# Patient Record
Sex: Male | Born: 1945 | Race: Black or African American | Hispanic: No | Marital: Single
Health system: Southern US, Community
[De-identification: ages and names within clinical notes are randomized; demographics above are authoritative.]

## PROBLEM LIST (undated history)

## (undated) DIAGNOSIS — I609 Nontraumatic subarachnoid hemorrhage, unspecified: Secondary | ICD-10-CM

## (undated) DIAGNOSIS — J181 Lobar pneumonia, unspecified organism: Secondary | ICD-10-CM

## (undated) DIAGNOSIS — Z930 Tracheostomy status: Secondary | ICD-10-CM

## (undated) DIAGNOSIS — J9621 Acute and chronic respiratory failure with hypoxia: Secondary | ICD-10-CM

---

## 2018-10-07 ENCOUNTER — Inpatient Hospital Stay
Admission: RE | Admit: 2018-10-07 | Discharge: 2018-11-02 | Disposition: A | Discharge status: Hospice | Payer: Medicare Other | Source: Other Acute Inpatient Hospital | Attending: Internal Medicine | Admitting: Internal Medicine

## 2018-10-07 DIAGNOSIS — Z931 Gastrostomy status: Secondary | ICD-10-CM

## 2018-10-07 DIAGNOSIS — J9621 Acute and chronic respiratory failure with hypoxia: Secondary | ICD-10-CM | POA: Diagnosis present

## 2018-10-07 DIAGNOSIS — I609 Nontraumatic subarachnoid hemorrhage, unspecified: Secondary | ICD-10-CM

## 2018-10-07 DIAGNOSIS — J181 Lobar pneumonia, unspecified organism: Secondary | ICD-10-CM | POA: Diagnosis present

## 2018-10-07 DIAGNOSIS — Z452 Encounter for adjustment and management of vascular access device: Secondary | ICD-10-CM

## 2018-10-07 DIAGNOSIS — Z930 Tracheostomy status: Secondary | ICD-10-CM

## 2018-10-07 DIAGNOSIS — R058 Other specified cough: Secondary | ICD-10-CM

## 2018-10-07 DIAGNOSIS — R05 Cough: Secondary | ICD-10-CM

## 2018-10-07 HISTORY — DX: Nontraumatic subarachnoid hemorrhage, unspecified: I60.9

## 2018-10-07 HISTORY — DX: Acute and chronic respiratory failure with hypoxia: J96.21

## 2018-10-07 HISTORY — DX: Lobar pneumonia, unspecified organism: J18.1

## 2018-10-07 HISTORY — DX: Tracheostomy status: Z93.0

## 2018-10-08 ENCOUNTER — Other Ambulatory Visit (HOSPITAL_COMMUNITY): Payer: Self-pay

## 2018-10-08 MED ORDER — IOHEXOL 300 MG/ML  SOLN
50.0000 mL | Freq: Once | INTRAMUSCULAR | Status: AC | PRN
  Administered 2018-10-08: 01:00:00 50 mL

## 2018-10-10 ENCOUNTER — Other Ambulatory Visit (HOSPITAL_COMMUNITY): Payer: Self-pay

## 2018-10-10 LAB — CBC
HCT: 33.1 % — ABNORMAL LOW (ref 39.0–52.0)
Hemoglobin: 10.7 g/dL — ABNORMAL LOW (ref 13.0–17.0)
MCH: 28.6 pg (ref 26.0–34.0)
MCHC: 32.3 g/dL (ref 30.0–36.0)
MCV: 88.5 fL (ref 80.0–100.0)
Platelets: 193 10*3/uL (ref 150–400)
RBC: 3.74 MIL/uL — ABNORMAL LOW (ref 4.22–5.81)
RDW: 13.8 % (ref 11.5–15.5)
WBC: 8.9 10*3/uL (ref 4.0–10.5)
nRBC: 0 % (ref 0.0–0.2)

## 2018-10-10 LAB — COMPREHENSIVE METABOLIC PANEL
ALT: 82 U/L — ABNORMAL HIGH (ref 0–44)
AST: 42 U/L — ABNORMAL HIGH (ref 15–41)
Albumin: 2.5 g/dL — ABNORMAL LOW (ref 3.5–5.0)
Alkaline Phosphatase: 104 U/L (ref 38–126)
Anion gap: 11 (ref 5–15)
BUN: 15 mg/dL (ref 8–23)
CO2: 23 mmol/L (ref 22–32)
Calcium: 9.4 mg/dL (ref 8.9–10.3)
Chloride: 99 mmol/L (ref 98–111)
Creatinine, Ser: 0.6 mg/dL — ABNORMAL LOW (ref 0.61–1.24)
GFR calc Af Amer: >60 mL/min (ref >60)
GFR calc non Af Amer: >60 mL/min (ref >60)
Glucose, Bld: 141 mg/dL — ABNORMAL HIGH (ref 70–99)
Potassium: 4.3 mmol/L (ref 3.5–5.1)
Sodium: 133 mmol/L — ABNORMAL LOW (ref 135–145)
Total Bilirubin: 0.3 mg/dL (ref 0.3–1.2)
Total Protein: 6.8 g/dL (ref 6.5–8.1)

## 2018-10-10 LAB — EXPECTORATED SPUTUM ASSESSMENT W GRAM STAIN, RFLX TO RESP C

## 2018-10-11 DIAGNOSIS — Z930 Tracheostomy status: Secondary | ICD-10-CM

## 2018-10-11 DIAGNOSIS — Z931 Gastrostomy status: Secondary | ICD-10-CM | POA: Diagnosis not present

## 2018-10-11 DIAGNOSIS — J9621 Acute and chronic respiratory failure with hypoxia: Secondary | ICD-10-CM | POA: Diagnosis not present

## 2018-10-11 DIAGNOSIS — I609 Nontraumatic subarachnoid hemorrhage, unspecified: Secondary | ICD-10-CM | POA: Diagnosis not present

## 2018-10-11 DIAGNOSIS — J181 Lobar pneumonia, unspecified organism: Secondary | ICD-10-CM | POA: Diagnosis not present

## 2018-10-11 NOTE — Consult Note (Signed)
[facility]  Date of Service: 10/11/2018  PULMONARY CRITICAL CARE CONSULT   Carlos DeedsWade Aguilar  JXB:[id number]  DOB: 12/11/1945   DOA: 10/07/2018  Referring Physician: Carron CurieAli Hijazi, MD  HPI: Carlos Aguilar is a 73 y.o. male seen for follow up of Acute on Chronic Respiratory Failure.  Patient has multiple medical problems presents to the hospital with an intracranial hemorrhage.  Patient was initially evaluated found to have difficulty with ambulation.  Was evaluated with a CT scan of the head which showed a subarachnoid hemorrhage.  Patient did go to neurosurgery at that time and had an EVD placed.  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  The patient did have an embolization done however did not really have much in the way of improvement.  Subsequently patient ended up having to have a tracheostomy done because of failure to come off the ventilator.  Patient right now is on T collar and we are titrating the oxygen slowly.  Review of Systems:  ROS performed and is unremarkable other than noted above.  Past medical history: Subarachnoid hemorrhage Hypertension  Past surgical history: Tracheostomy PEG tube placement VP shunt Embolectomy  Family history: Noncontributory to the present illness  Social history: Unknown if ever smoking or drinking history. Medications: Reviewed on Rounds  Physical Exam:  Vitals: Temperature 98.8 pulse 83 respiratory rate 33 blood pressure 125/74 saturations 100%  Ventilator Settings off the ventilator right now on T collar currently on 28% FiO2  . General: Comfortable at this time . Eyes: Grossly normal lids, irises & conjunctiva . ENT: grossly tongue is normal . Neck: no obvious mass . Cardiovascular: S1-S2 normal no gallop or rub . Respiratory: No rhonchi no rales are noted . Abdomen: Soft and  nontender . Skin: no rash seen on limited exam . Musculoskeletal: not rigid . Psychiatric:unable to assess . Neurologic: no seizure no involuntary movements         Labs on Admission:  Basic Metabolic Panel: Recent Labs  Lab 10/10/18 0724  NA 133*  K 4.3  CL 99  CO2 23  GLUCOSE 141*  BUN 15  CREATININE 0.60*  CALCIUM 9.4    No results for input(s): PHART, PCO2ART, PO2ART, HCO3, O2SAT in the last 168 hours.  Liver Function Tests: Recent Labs  Lab 10/10/18 0724  AST 42*  ALT 82*  ALKPHOS 104  BILITOT 0.3  PROT 6.8  ALBUMIN 2.5*   No results for input(s): LIPASE, AMYLASE in the last 168 hours. No results for input(s): AMMONIA in the last 168 hours.  CBC: Recent Labs  Lab 10/10/18 0724  WBC 8.9  HGB 10.7*  HCT 33.1*  MCV 88.5  PLT 193    Cardiac Enzymes: No results for input(s): CKTOTAL, CKMB, CKMBINDEX, TROPONINI in the last 168 hours.  BNP (last 3 results) No results for input(s): BNP in the last 8760 hours.  ProBNP (last 3 results) No results for input(s): PROBNP in the last 8760 hours.   Radiological Exams on Admission: Dg Abdomen Peg Tube Location  Result Date: 10/08/2018 CLINICAL DATA:  Peg tube EXAM: ABDOMEN - 1 VIEW COMPARISON:  None. FINDINGS: Peg tube projects over the left upper abdomen. Contrast material is seen within the fundus of the stomach. No contrast extravasation. Nonobstructive bowel gas pattern. IMPRESSION: Peg tube within the stomach. Injected contrast is within the fundus of the stomach. No extravasation. Electronically  Signed   By: Rolm Baptise M.D.   On: 10/08/2018 01:39   Dg Chest Port 1 View  Result Date: 10/10/2018 CLINICAL DATA:  Chest pain and shortness of breath. EXAM: PORTABLE CHEST 1 VIEW COMPARISON:  10/08/2018 chest radiograph FINDINGS: This is a low volume film. A LEFT PICC line with tip overlying the SUPERIOR cavoatrial junction and a tracheostomy tube are again noted. Cardiomediastinal silhouette is unchanged.  Minimal bibasilar atelectasis noted. There is no evidence of focal airspace disease, pulmonary edema, suspicious pulmonary nodule/mass, pleural effusion, or pneumothorax. No acute bony abnormalities are identified. IMPRESSION: Low volume film with minimal bibasilar atelectasis. No other significant change. Electronically Signed   By: Margarette Canada M.D.   On: 10/10/2018 10:26   Dg Chest Port 1 View  Result Date: 10/08/2018 CLINICAL DATA:  PICC line EXAM: PORTABLE CHEST 1 VIEW COMPARISON:  None. FINDINGS: Left PICC line is in place with the tip in the upper right atrium. Tracheostomy projects over the mid trachea. Linear atelectasis at the left base. Right lung clear. Heart is normal size. No acute bony abnormality. IMPRESSION: Left PICC line tip in the upper right atrium. Left base atelectasis.  No active cardiopulmonary disease. Electronically Signed   By: Rolm Baptise M.D.   On: 10/08/2018 01:40    Assessment/Plan Active Problems:   Acute on chronic respiratory failure with hypoxia (HCC)   Subarachnoid hemorrhage, nontraumatic (HCC)   Lobar pneumonia, unspecified organism ([place])   Tracheostomy status ([place])   1. Acute on chronic respiratory failure with hypoxia currently patient is tolerating PT collar fairly well.  Patient now is on 28% FiO2 good saturations are noted.  Titrate oxygen continue aggressive pulmonary toilet. 2. Subarachnoid hemorrhage patient is status post embolization we will continue with supportive care and physical therapy as tolerated. 3. Lobar pneumonia was treated with antibiotics we will continue to monitor radiologically.  The latest x-ray showing low volume film with bibasilar infiltrates. 4. Tracheostomy status we are going to work towards eventual decannulation continue with supportive care  I have personally seen and evaluated the patient, evaluated laboratory and imaging results, formulated the assessment and plan and placed orders. The Patient requires high complexity  decision making for assessment and support.  Case was discussed on Rounds with the Respiratory Therapy Staff Time Spent 102minutes  Allyne Gee, MD [facility] Pulmonary Critical Care Medicine Sleep Medicine

## 2018-10-12 DIAGNOSIS — I609 Nontraumatic subarachnoid hemorrhage, unspecified: Secondary | ICD-10-CM | POA: Diagnosis not present

## 2018-10-12 DIAGNOSIS — Z930 Tracheostomy status: Secondary | ICD-10-CM | POA: Diagnosis not present

## 2018-10-12 DIAGNOSIS — J9621 Acute and chronic respiratory failure with hypoxia: Secondary | ICD-10-CM | POA: Diagnosis not present

## 2018-10-12 DIAGNOSIS — J181 Lobar pneumonia, unspecified organism: Secondary | ICD-10-CM | POA: Diagnosis not present

## 2018-10-12 NOTE — Progress Notes (Addendum)
Pulmonary Critical Care Medicine [place]   PULMONARY CRITICAL CARE SERVICE  PROGRESS NOTE  Date of Service: 10/12/2018  Carlos Aguilar  OZH:[id number]  DOB: [birth year]   DOA: 10/07/2018  Referring Physician: Merton Border, MD  HPI: Carlos Aguilar is a 73 y.o. male seen for follow up of Acute on Chronic Respiratory Failure.  Patient continues on aerosol trach 20% FiO2 using PMV with no difficulty.  Medications: Reviewed on Rounds  Physical Exam:  Vitals: Pulse 74 respirations 15 BP 138/81 O2 sat 1% temp 94.2  Ventilator Settings 21% ATC  . General: Comfortable at this time . Eyes: Grossly normal lids, irises & conjunctiva . ENT: grossly tongue is normal . Neck: no obvious mass . Cardiovascular: S1 S2 normal no gallop . Respiratory: No rales rhonchi noted . Abdomen: soft . Skin: no rash seen on limited exam . Musculoskeletal: not rigid . Psychiatric:unable to assess . Neurologic: no seizure no involuntary movements         Lab Data:   Basic Metabolic Panel: Recent Labs  Lab 10/10/18 0724  NA 133*  K 4.3  CL 99  CO2 23  GLUCOSE 141*  BUN 15  CREATININE 0.60*  CALCIUM 9.4    ABG: No results for input(s): PHART, PCO2ART, PO2ART, HCO3, O2SAT in the last 168 hours.  Liver Function Tests: Recent Labs  Lab 10/10/18 0724  AST 42*  ALT 82*  ALKPHOS 104  BILITOT 0.3  PROT 6.8  ALBUMIN 2.5*   No results for input(s): LIPASE, AMYLASE in the last 168 hours. No results for input(s): AMMONIA in the last 168 hours.  CBC: Recent Labs  Lab 10/10/18 0724  WBC 8.9  HGB 10.7*  HCT 33.1*  MCV 88.5  PLT 193    Cardiac Enzymes: No results for input(s): CKTOTAL, CKMB, CKMBINDEX, TROPONINI in the last 168 hours.  BNP (last 3 results) No results for input(s): BNP in the last 8760 hours.  ProBNP (last 3 results) No results for input(s): PROBNP in the last 8760 hours.  Radiological Exams: No results found.  Assessment/Plan Active Problems:    * No active hospital problems. *   1. Acute on chronic respiratory failure with hypoxia currently patient is tolerating PT collar fairly well.  Patient now is on 28% FiO2 good saturations are noted.  Titrate oxygen continue aggressive pulmonary toilet. 2. Subarachnoid hemorrhage patient is status post embolization we will continue with supportive care and physical therapy as tolerated. 3. Lobar pneumonia was treated with antibiotics we will continue to monitor radiologically.  The latest x-ray showing low volume film with bibasilar infiltrates. 4. Tracheostomy status we are going to work towards eventual decannulation continue with supportive care   I have personally seen and evaluated the patient, evaluated laboratory and imaging results, formulated the assessment and plan and placed orders. The Patient requires high complexity decision making for assessment and support.  Case was discussed on Rounds with the Respiratory Therapy Staff  Allyne Gee, MD [facility] Pulmonary Critical Care Medicine Sleep Medicine

## 2018-10-13 DIAGNOSIS — I609 Nontraumatic subarachnoid hemorrhage, unspecified: Secondary | ICD-10-CM | POA: Diagnosis not present

## 2018-10-13 DIAGNOSIS — J9621 Acute and chronic respiratory failure with hypoxia: Secondary | ICD-10-CM | POA: Diagnosis not present

## 2018-10-13 DIAGNOSIS — J181 Lobar pneumonia, unspecified organism: Secondary | ICD-10-CM | POA: Diagnosis not present

## 2018-10-13 DIAGNOSIS — Z930 Tracheostomy status: Secondary | ICD-10-CM | POA: Diagnosis not present

## 2018-10-13 LAB — CULTURE, RESPIRATORY W GRAM STAIN

## 2018-10-13 LAB — BASIC METABOLIC PANEL
Anion gap: 10 (ref 5–15)
BUN: 12 mg/dL (ref 8–23)
CO2: 26 mmol/L (ref 22–32)
Calcium: 8.9 mg/dL (ref 8.9–10.3)
Chloride: 97 mmol/L — ABNORMAL LOW (ref 98–111)
Creatinine, Ser: 0.44 mg/dL — ABNORMAL LOW (ref 0.61–1.24)
GFR calc Af Amer: >60 mL/min (ref >60)
GFR calc non Af Amer: >60 mL/min (ref >60)
Glucose, Bld: 141 mg/dL — ABNORMAL HIGH (ref 70–99)
Potassium: 4.1 mmol/L (ref 3.5–5.1)
Sodium: 133 mmol/L — ABNORMAL LOW (ref 135–145)

## 2018-10-13 LAB — C DIFFICILE QUICK SCREEN W PCR REFLEX
C Diff antigen: NEGATIVE
C Diff interpretation: NOT DETECTED
C Diff toxin: NEGATIVE

## 2018-10-13 NOTE — Progress Notes (Signed)
Pulmonary Critical Care Medicine [place]   PULMONARY CRITICAL CARE SERVICE  PROGRESS NOTE  Date of Service: 10/13/2018  Carlos Aguilar  GEX:[id number]  DOB: Oct 29, 1945   DOA: 10/07/2018  Referring Physician: Merton Border, MD  HPI: Carlos Aguilar is a 73 y.o. male seen for follow up of Acute on Chronic Respiratory Failure.  Patient at this time is on T collar we should be able to go ahead and change the trach out today right now is on 28% FiO2  Medications: Reviewed on Rounds  Physical Exam:  Vitals: Temperature 97.1 pulse 80 respiratory 26 blood pressure 119/69 saturations 96%  Ventilator Settings off the ventilator right now on T collar FiO2 28%  . General: Comfortable at this time . Eyes: Grossly normal lids, irises & conjunctiva . ENT: grossly tongue is normal . Neck: no obvious mass . Cardiovascular: S1 S2 normal no gallop . Respiratory: No rhonchi coarse breath sounds are noted . Abdomen: soft . Skin: no rash seen on limited exam . Musculoskeletal: not rigid . Psychiatric:unable to assess . Neurologic: no seizure no involuntary movements         Lab Data:   Basic Metabolic Panel: Recent Labs  Lab 10/10/18 0724 10/13/18 0706  NA 133* 133*  K 4.3 4.1  CL 99 97*  CO2 23 26  GLUCOSE 141* 141*  BUN 15 12  CREATININE 0.60* 0.44*  CALCIUM 9.4 8.9    ABG: No results for input(s): PHART, PCO2ART, PO2ART, HCO3, O2SAT in the last 168 hours.  Liver Function Tests: Recent Labs  Lab 10/10/18 0724  AST 42*  ALT 82*  ALKPHOS 104  BILITOT 0.3  PROT 6.8  ALBUMIN 2.5*   No results for input(s): LIPASE, AMYLASE in the last 168 hours. No results for input(s): AMMONIA in the last 168 hours.  CBC: Recent Labs  Lab 10/10/18 0724  WBC 8.9  HGB 10.7*  HCT 33.1*  MCV 88.5  PLT 193    Cardiac Enzymes: No results for input(s): CKTOTAL, CKMB, CKMBINDEX, TROPONINI in the last 168 hours.  BNP (last 3 results) No results for input(s): BNP in the  last 8760 hours.  ProBNP (last 3 results) No results for input(s): PROBNP in the last 8760 hours.  Radiological Exams: No results found.  Assessment/Plan Active Problems:   Acute on chronic respiratory failure with hypoxia (HCC)   Subarachnoid hemorrhage, nontraumatic (HCC)   Lobar pneumonia, unspecified organism ([place])   Tracheostomy status ([place])   1. Acute on chronic respiratory failure with hypoxia we will go ahead and change the trach out to a cuffless #6 trach today and continue to advance the weaning. 2. Subarachnoid hemorrhage grossly unchanged we will continue with supportive care 3. Lobar pneumonia treated follow-up radiologically 4. Tracheostomy status we will continue to wean   I have personally seen and evaluated the patient, evaluated laboratory and imaging results, formulated the assessment and plan and placed orders. The Patient requires high complexity decision making for assessment and support.  Case was discussed on Rounds with the Respiratory Therapy Staff  Allyne Gee, MD [facility] Pulmonary Critical Care Medicine Sleep Medicine

## 2018-10-14 DIAGNOSIS — J181 Lobar pneumonia, unspecified organism: Secondary | ICD-10-CM | POA: Diagnosis not present

## 2018-10-14 DIAGNOSIS — J9621 Acute and chronic respiratory failure with hypoxia: Secondary | ICD-10-CM | POA: Diagnosis not present

## 2018-10-14 DIAGNOSIS — I609 Nontraumatic subarachnoid hemorrhage, unspecified: Secondary | ICD-10-CM | POA: Diagnosis not present

## 2018-10-14 DIAGNOSIS — Z930 Tracheostomy status: Secondary | ICD-10-CM | POA: Diagnosis not present

## 2018-10-14 LAB — LEVETIRACETAM LEVEL: Levetiracetam Lvl: 31.7 ug/mL (ref 10.0–40.0)

## 2018-10-14 NOTE — Progress Notes (Addendum)
Pulmonary Critical Care Medicine [place]   PULMONARY CRITICAL CARE SERVICE  PROGRESS NOTE  Date of Service: 10/14/2018  Carlos Aguilar  AJO:[id number]  DOB: [birth year]   DOA: 10/07/2018  Referring Physician: Merton Border, MD  HPI: Carlos Aguilar is a 73 y.o. male seen for follow up of Acute on Chronic Respiratory Failure.  Patient mains on aerosol trach collar 28% FiO2 with moderate secretions.  Medications: Reviewed on Rounds  Physical Exam:  Vitals: Pulse 73 respirations 16 BP 110/63 O2 sat 100% temp 90.9  Ventilator Settings aerosol trach collar 28%  . General: Comfortable at this time . Eyes: Grossly normal lids, irises & conjunctiva . ENT: grossly tongue is normal . Neck: no obvious mass . Cardiovascular: S1 S2 normal no gallop . Respiratory: No rales or rhonchi noted . Abdomen: soft . Skin: no rash seen on limited exam . Musculoskeletal: not rigid . Psychiatric:unable to assess . Neurologic: no seizure no involuntary movements         Lab Data:   Basic Metabolic Panel: Recent Labs  Lab 10/10/18 0724 10/13/18 0706  NA 133* 133*  K 4.3 4.1  CL 99 97*  CO2 23 26  GLUCOSE 141* 141*  BUN 15 12  CREATININE 0.60* 0.44*  CALCIUM 9.4 8.9    ABG: No results for input(s): PHART, PCO2ART, PO2ART, HCO3, O2SAT in the last 168 hours.  Liver Function Tests: Recent Labs  Lab 10/10/18 0724  AST 42*  ALT 82*  ALKPHOS 104  BILITOT 0.3  PROT 6.8  ALBUMIN 2.5*   No results for input(s): LIPASE, AMYLASE in the last 168 hours. No results for input(s): AMMONIA in the last 168 hours.  CBC: Recent Labs  Lab 10/10/18 0724  WBC 8.9  HGB 10.7*  HCT 33.1*  MCV 88.5  PLT 193    Cardiac Enzymes: No results for input(s): CKTOTAL, CKMB, CKMBINDEX, TROPONINI in the last 168 hours.  BNP (last 3 results) No results for input(s): BNP in the last 8760 hours.  ProBNP (last 3 results) No results for input(s): PROBNP in the last 8760  hours.  Radiological Exams: No results found.  Assessment/Plan Active Problems:   Acute on chronic respiratory failure with hypoxia (HCC)   Subarachnoid hemorrhage, nontraumatic (HCC)   Lobar pneumonia, unspecified organism ([place])   Tracheostomy status ([place])   1. Acute on chronic respiratory failure with hypoxia currently patient is tolerating PT collar fairly well.  Patient now is on 28% FiO2 good saturations are noted.  Titrate oxygen continue aggressive pulmonary toilet. 2. Subarachnoid hemorrhage patient is status post embolization we will continue with supportive care and physical therapy as tolerated. 3. Lobar pneumonia was treated with antibiotics we will continue to monitor radiologically.  The latest x-ray showing low volume film with bibasilar infiltrates. 4. Tracheostomy status we are going to work towards eventual decannulation continue with supportive care   I have personally seen and evaluated the patient, evaluated laboratory and imaging results, formulated the assessment and plan and placed orders. The Patient requires high complexity decision making for assessment and support.  Case was discussed on Rounds with the Respiratory Therapy Staff  Allyne Gee, MD [facility] Pulmonary Critical Care Medicine Sleep Medicine

## 2018-10-15 ENCOUNTER — Encounter: Payer: Self-pay | Admitting: Internal Medicine

## 2018-10-15 DIAGNOSIS — I609 Nontraumatic subarachnoid hemorrhage, unspecified: Secondary | ICD-10-CM | POA: Diagnosis not present

## 2018-10-15 DIAGNOSIS — J181 Lobar pneumonia, unspecified organism: Secondary | ICD-10-CM | POA: Diagnosis present

## 2018-10-15 DIAGNOSIS — Z930 Tracheostomy status: Secondary | ICD-10-CM | POA: Diagnosis not present

## 2018-10-15 DIAGNOSIS — J9621 Acute and chronic respiratory failure with hypoxia: Secondary | ICD-10-CM | POA: Diagnosis not present

## 2018-10-15 LAB — BASIC METABOLIC PANEL
Anion gap: 10 (ref 5–15)
BUN: 7 mg/dL — ABNORMAL LOW (ref 8–23)
CO2: 29 mmol/L (ref 22–32)
Calcium: 9.1 mg/dL (ref 8.9–10.3)
Chloride: 96 mmol/L — ABNORMAL LOW (ref 98–111)
Creatinine, Ser: 0.51 mg/dL — ABNORMAL LOW (ref 0.61–1.24)
GFR calc Af Amer: >60 mL/min (ref >60)
GFR calc non Af Amer: >60 mL/min (ref >60)
Glucose, Bld: 127 mg/dL — ABNORMAL HIGH (ref 70–99)
Potassium: 4.1 mmol/L (ref 3.5–5.1)
Sodium: 135 mmol/L (ref 135–145)

## 2018-10-15 NOTE — Progress Notes (Addendum)
Pulmonary Critical Care Medicine [place]   PULMONARY CRITICAL CARE SERVICE  PROGRESS NOTE  Date of Service: 10/15/2018  Carlos Aguilar  GOT:[id number]  DOB: [birth year]   DOA: 10/07/2018  Referring Physician: Merton Border, MD  HPI: Carlos Aguilar is a 73 y.o. male seen for follow up of Acute on Chronic Respiratory Failure.  Patient is on aerosol trach collar 28% satting well with no distress.  Medications: Reviewed on Rounds  Physical Exam:  Vitals: Pulse 80 respirations 18 BP 122/71 O2 sat 100% temp 97.7  Ventilator Settings ATC 20%  . General: Comfortable at this time . Eyes: Grossly normal lids, irises & conjunctiva . ENT: grossly tongue is normal . Neck: no obvious mass . Cardiovascular: S1 S2 normal no gallop . Respiratory: No rales or rhonchi noted . Abdomen: soft . Skin: no rash seen on limited exam . Musculoskeletal: not rigid . Psychiatric:unable to assess . Neurologic: no seizure no involuntary movements         Lab Data:   Basic Metabolic Panel: Recent Labs  Lab 10/10/18 0724 10/13/18 0706 10/15/18 0714  NA 133* 133* 135  K 4.3 4.1 4.1  CL 99 97* 96*  CO2 23 26 29   GLUCOSE 141* 141* 127*  BUN 15 12 7*  CREATININE 0.60* 0.44* 0.51*  CALCIUM 9.4 8.9 9.1    ABG: No results for input(s): PHART, PCO2ART, PO2ART, HCO3, O2SAT in the last 168 hours.  Liver Function Tests: Recent Labs  Lab 10/10/18 0724  AST 42*  ALT 82*  ALKPHOS 104  BILITOT 0.3  PROT 6.8  ALBUMIN 2.5*   No results for input(s): LIPASE, AMYLASE in the last 168 hours. No results for input(s): AMMONIA in the last 168 hours.  CBC: Recent Labs  Lab 10/10/18 0724  WBC 8.9  HGB 10.7*  HCT 33.1*  MCV 88.5  PLT 193    Cardiac Enzymes: No results for input(s): CKTOTAL, CKMB, CKMBINDEX, TROPONINI in the last 168 hours.  BNP (last 3 results) No results for input(s): BNP in the last 8760 hours.  ProBNP (last 3 results) No results for input(s): PROBNP in the  last 8760 hours.  Radiological Exams: No results found.  Assessment/Plan Active Problems:   Acute on chronic respiratory failure with hypoxia (HCC)   Subarachnoid hemorrhage, nontraumatic (HCC)   Lobar pneumonia, unspecified organism ([place])   Tracheostomy status ([place])   1. Acute on chronic respiratory failure with hypoxia currently patient is tolerating PT collar fairly well.  Patient now is on 28% FiO2 good saturations are noted.  Titrate oxygen continue aggressive pulmonary toilet. 2. Subarachnoid hemorrhage patient is status post embolization continue supportive care 3. Lobar pneumonia was treated with antibiotics we will continue to monitor radiologically.  4. Tracheostomy status we are going to work towards eventual decannulation continue with supportive care   I have personally seen and evaluated the patient, evaluated laboratory and imaging results, formulated the assessment and plan and placed orders. The Patient requires high complexity decision making for assessment and support.  Case was discussed on Rounds with the Respiratory Therapy Staff  Allyne Gee, MD [facility] Pulmonary Critical Care Medicine Sleep Medicine

## 2018-10-17 DIAGNOSIS — Z930 Tracheostomy status: Secondary | ICD-10-CM | POA: Diagnosis not present

## 2018-10-17 DIAGNOSIS — J181 Lobar pneumonia, unspecified organism: Secondary | ICD-10-CM | POA: Diagnosis not present

## 2018-10-17 DIAGNOSIS — I609 Nontraumatic subarachnoid hemorrhage, unspecified: Secondary | ICD-10-CM | POA: Diagnosis not present

## 2018-10-17 DIAGNOSIS — J9621 Acute and chronic respiratory failure with hypoxia: Secondary | ICD-10-CM | POA: Diagnosis not present

## 2018-10-17 LAB — CBC
HCT: 31.2 % — ABNORMAL LOW (ref 39.0–52.0)
Hemoglobin: 9.7 g/dL — ABNORMAL LOW (ref 13.0–17.0)
MCH: 27.8 pg (ref 26.0–34.0)
MCHC: 31.1 g/dL (ref 30.0–36.0)
MCV: 89.4 fL (ref 80.0–100.0)
Platelets: 127 10*3/uL — ABNORMAL LOW (ref 150–400)
RBC: 3.49 MIL/uL — ABNORMAL LOW (ref 4.22–5.81)
RDW: 14.6 % (ref 11.5–15.5)
WBC: 7.1 10*3/uL (ref 4.0–10.5)
nRBC: 0 % (ref 0.0–0.2)

## 2018-10-17 LAB — BASIC METABOLIC PANEL
Anion gap: 10 (ref 5–15)
BUN: 8 mg/dL (ref 8–23)
CO2: 30 mmol/L (ref 22–32)
Calcium: 9.1 mg/dL (ref 8.9–10.3)
Chloride: 94 mmol/L — ABNORMAL LOW (ref 98–111)
Creatinine, Ser: 0.5 mg/dL — ABNORMAL LOW (ref 0.61–1.24)
GFR calc Af Amer: >60 mL/min (ref >60)
GFR calc non Af Amer: >60 mL/min (ref >60)
Glucose, Bld: 137 mg/dL — ABNORMAL HIGH (ref 70–99)
Potassium: 3.8 mmol/L (ref 3.5–5.1)
Sodium: 134 mmol/L — ABNORMAL LOW (ref 135–145)

## 2018-10-17 NOTE — Progress Notes (Signed)
Pulmonary Critical Care Medicine [place]   PULMONARY CRITICAL CARE SERVICE  PROGRESS NOTE  Date of Service: 10/17/2018  Carlos Aguilar  NAT:[id number]  DOB: 07-02-1945   DOA: 10/07/2018  Referring Physician: Merton Border, MD  HPI: Carlos Aguilar is a 72 y.o. male seen for follow up of Acute on Chronic Respiratory Failure.  Patient is comfortable right now without distress is on T collar has been on 28% FiO2  Medications: Reviewed on Rounds  Physical Exam:  Vitals: Temperature 95.9 pulse 100 respiratory rate 16 blood pressure 129/79 saturations 99%  Ventilator Settings patient currently is on T collar with a 28% FiO2  . General: Comfortable at this time . Eyes: Grossly normal lids, irises & conjunctiva . ENT: grossly tongue is normal . Neck: no obvious mass . Cardiovascular: S1 S2 normal no gallop . Respiratory: No rhonchi no rales are noted at this time . Abdomen: soft . Skin: no rash seen on limited exam . Musculoskeletal: not rigid . Psychiatric:unable to assess . Neurologic: no seizure no involuntary movements         Lab Data:   Basic Metabolic Panel: Recent Labs  Lab 10/13/18 0706 10/15/18 0714 10/17/18 0707  NA 133* 135 134*  K 4.1 4.1 3.8  CL 97* 96* 94*  CO2 26 29 30   GLUCOSE 141* 127* 137*  BUN 12 7* 8  CREATININE 0.44* 0.51* 0.50*  CALCIUM 8.9 9.1 9.1    ABG: No results for input(s): PHART, PCO2ART, PO2ART, HCO3, O2SAT in the last 168 hours.  Liver Function Tests: No results for input(s): AST, ALT, ALKPHOS, BILITOT, PROT, ALBUMIN in the last 168 hours. No results for input(s): LIPASE, AMYLASE in the last 168 hours. No results for input(s): AMMONIA in the last 168 hours.  CBC: Recent Labs  Lab 10/17/18 0707  WBC 7.1  HGB 9.7*  HCT 31.2*  MCV 89.4  PLT 127*    Cardiac Enzymes: No results for input(s): CKTOTAL, CKMB, CKMBINDEX, TROPONINI in the last 168 hours.  BNP (last 3 results) No results for input(s): BNP in the  last 8760 hours.  ProBNP (last 3 results) No results for input(s): PROBNP in the last 8760 hours.  Radiological Exams: No results found.  Assessment/Plan Active Problems:   Acute on chronic respiratory failure with hypoxia (HCC)   Subarachnoid hemorrhage, nontraumatic (HCC)   Lobar pneumonia, unspecified organism ([place])   Tracheostomy status ([place])   1. Acute on chronic respiratory failure with hypoxia we will continue T collar continue aggressive pulmonary toilet supportive care. 2. Subarachnoid hemorrhage patient is grossly unchanged we will continue to follow along. 3. Lobar pneumonia treated showing signs of improvement 4. Tracheostomy working towards capping and eventual decannulation.   I have personally seen and evaluated the patient, evaluated laboratory and imaging results, formulated the assessment and plan and placed orders. The Patient requires high complexity decision making for assessment and support.  Case was discussed on Rounds with the Respiratory Therapy Staff  Allyne Gee, MD [facility] Pulmonary Critical Care Medicine Sleep Medicine

## 2018-10-18 DIAGNOSIS — J181 Lobar pneumonia, unspecified organism: Secondary | ICD-10-CM | POA: Diagnosis not present

## 2018-10-18 DIAGNOSIS — I609 Nontraumatic subarachnoid hemorrhage, unspecified: Secondary | ICD-10-CM | POA: Diagnosis not present

## 2018-10-18 DIAGNOSIS — Z930 Tracheostomy status: Secondary | ICD-10-CM | POA: Diagnosis not present

## 2018-10-18 DIAGNOSIS — J9621 Acute and chronic respiratory failure with hypoxia: Secondary | ICD-10-CM | POA: Diagnosis not present

## 2018-10-18 NOTE — Progress Notes (Signed)
Pulmonary Critical Care Medicine [place]   PULMONARY CRITICAL CARE SERVICE  PROGRESS NOTE  Date of Service: 10/18/2018  Carlos Aguilar  [id number]  DOB: 1945-03-05   DOA: 10/07/2018  Referring Physician: Merton Border, MD  HPI: Carlos Aguilar is a 73 y.o. male seen for follow up of Acute on Chronic Respiratory Failure.  Patient is doing well right now is collar has been on 28% FiO2 reportedly has moderate secretions still  Medications: Reviewed on Rounds  Physical Exam:  Vitals: Temperature 96.4 pulse 98 respiratory rate 16 blood pressure 114/65 saturations 98%  Ventilator Settings off the ventilator right now on T collar with an FiO2 of 28%  . General: Comfortable at this time . Eyes: Grossly normal lids, irises & conjunctiva . ENT: grossly tongue is normal . Neck: no obvious mass . Cardiovascular: S1 S2 normal no gallop . Respiratory: No rhonchi coarse breath sounds are noted . Abdomen: soft . Skin: no rash seen on limited exam . Musculoskeletal: not rigid . Psychiatric:unable to assess . Neurologic: no seizure no involuntary movements         Lab Data:   Basic Metabolic Panel: Recent Labs  Lab 10/13/18 0706 10/15/18 0714 10/17/18 0707  NA 133* 135 134*  K 4.1 4.1 3.8  CL 97* 96* 94*  CO2 26 29 30   GLUCOSE 141* 127* 137*  BUN 12 7* 8  CREATININE 0.44* 0.51* 0.50*  CALCIUM 8.9 9.1 9.1    ABG: No results for input(s): PHART, PCO2ART, PO2ART, HCO3, O2SAT in the last 168 hours.  Liver Function Tests: No results for input(s): AST, ALT, ALKPHOS, BILITOT, PROT, ALBUMIN in the last 168 hours. No results for input(s): LIPASE, AMYLASE in the last 168 hours. No results for input(s): AMMONIA in the last 168 hours.  CBC: Recent Labs  Lab 10/17/18 0707  WBC 7.1  HGB 9.7*  HCT 31.2*  MCV 89.4  PLT 127*    Cardiac Enzymes: No results for input(s): CKTOTAL, CKMB, CKMBINDEX, TROPONINI in the last 168 hours.  BNP (last 3 results) No results  for input(s): BNP in the last 8760 hours.  ProBNP (last 3 results) No results for input(s): PROBNP in the last 8760 hours.  Radiological Exams: No results found.  Assessment/Plan Active Problems:   Acute on chronic respiratory failure with hypoxia (HCC)   Subarachnoid hemorrhage, nontraumatic (HCC)   Lobar pneumonia, unspecified organism ([place])   Tracheostomy status ([place])   1. Acute on chronic respiratory failure with hypoxia we will continue with T collar trials titrate oxygen continue pulmonary toilet. 2. Subarachnoid hemorrhage nontraumatic we will continue with supportive care 3. Lobar pneumonia unspecified continue with present management 4. Tracheostomy working towards self only capping trials   I have personally seen and evaluated the patient, evaluated laboratory and imaging results, formulated the assessment and plan and placed orders. The Patient requires high complexity decision making for assessment and support.  Case was discussed on Rounds with the Respiratory Therapy Staff  Allyne Gee, MD [facility] Pulmonary Critical Care Medicine Sleep Medicine

## 2018-10-19 DIAGNOSIS — I609 Nontraumatic subarachnoid hemorrhage, unspecified: Secondary | ICD-10-CM | POA: Diagnosis not present

## 2018-10-19 DIAGNOSIS — Z930 Tracheostomy status: Secondary | ICD-10-CM | POA: Diagnosis not present

## 2018-10-19 DIAGNOSIS — J181 Lobar pneumonia, unspecified organism: Secondary | ICD-10-CM | POA: Diagnosis not present

## 2018-10-19 DIAGNOSIS — J9621 Acute and chronic respiratory failure with hypoxia: Secondary | ICD-10-CM | POA: Diagnosis not present

## 2018-10-19 LAB — BASIC METABOLIC PANEL
Anion gap: 10 (ref 5–15)
BUN: 8 mg/dL (ref 8–23)
CO2: 28 mmol/L (ref 22–32)
Calcium: 9 mg/dL (ref 8.9–10.3)
Chloride: 93 mmol/L — ABNORMAL LOW (ref 98–111)
Creatinine, Ser: 0.46 mg/dL — ABNORMAL LOW (ref 0.61–1.24)
GFR calc Af Amer: >60 mL/min (ref >60)
GFR calc non Af Amer: >60 mL/min (ref >60)
Glucose, Bld: 114 mg/dL — ABNORMAL HIGH (ref 70–99)
Potassium: 3.8 mmol/L (ref 3.5–5.1)
Sodium: 131 mmol/L — ABNORMAL LOW (ref 135–145)

## 2018-10-19 NOTE — Progress Notes (Signed)
Pulmonary Critical Care Medicine [place]   PULMONARY CRITICAL CARE SERVICE  PROGRESS NOTE  Date of Service: 10/19/2018  Matyas Baisley  [id number]  DOB: [birth year]   DOA: 10/07/2018  Referring Physician: Merton Border, MD  HPI: Carlos Aguilar is a 73 y.o. male seen for follow up of Acute on Chronic Respiratory Failure.  Patient is currently on T collar has been on 28% FiO2 with good saturations.  Medications: Reviewed on Rounds  Physical Exam:  Vitals: Temperature 96.7 pulse 76 respiratory rate 17 blood pressure 137/73 saturations 95%  Ventilator Settings off the ventilator right now on T collar  . General: Comfortable at this time . Eyes: Grossly normal lids, irises & conjunctiva . ENT: grossly tongue is normal . Neck: no obvious mass . Cardiovascular: S1 S2 normal no gallop . Respiratory: No rhonchi coarse breath sounds are noted . Abdomen: soft . Skin: no rash seen on limited exam . Musculoskeletal: not rigid . Psychiatric:unable to assess . Neurologic: no seizure no involuntary movements         Lab Data:   Basic Metabolic Panel: Recent Labs  Lab 10/13/18 0706 10/15/18 0714 10/17/18 0707 10/19/18 0558  NA 133* 135 134* 131*  K 4.1 4.1 3.8 3.8  CL 97* 96* 94* 93*  CO2 26 29 30 28   GLUCOSE 141* 127* 137* 114*  BUN 12 7* 8 8  CREATININE 0.44* 0.51* 0.50* 0.46*  CALCIUM 8.9 9.1 9.1 9.0    ABG: No results for input(s): PHART, PCO2ART, PO2ART, HCO3, O2SAT in the last 168 hours.  Liver Function Tests: No results for input(s): AST, ALT, ALKPHOS, BILITOT, PROT, ALBUMIN in the last 168 hours. No results for input(s): LIPASE, AMYLASE in the last 168 hours. No results for input(s): AMMONIA in the last 168 hours.  CBC: Recent Labs  Lab 10/17/18 0707  WBC 7.1  HGB 9.7*  HCT 31.2*  MCV 89.4  PLT 127*    Cardiac Enzymes: No results for input(s): CKTOTAL, CKMB, CKMBINDEX, TROPONINI in the last 168 hours.  BNP (last 3 results) No  results for input(s): BNP in the last 8760 hours.  ProBNP (last 3 results) No results for input(s): PROBNP in the last 8760 hours.  Radiological Exams: No results found.  Assessment/Plan Active Problems:   Acute on chronic respiratory failure with hypoxia (HCC)   Subarachnoid hemorrhage, nontraumatic (HCC)   Lobar pneumonia, unspecified organism ([place])   Tracheostomy status ([place])   1. Acute on chronic respiratory failure with hypoxia we will continue with T collar trials titrate oxygen continue pulmonary toilet. 2. Subarachnoid hemorrhage unchanged continue with supportive care 3. Lobar pneumonia treated clinically improving white count is normal 4. Tracheostomy remains in place   I have personally seen and evaluated the patient, evaluated laboratory and imaging results, formulated the assessment and plan and placed orders. The Patient requires high complexity decision making for assessment and support.  Case was discussed on Rounds with the Respiratory Therapy Staff  Allyne Gee, MD [facility] Pulmonary Critical Care Medicine Sleep Medicine

## 2018-10-20 DIAGNOSIS — Z930 Tracheostomy status: Secondary | ICD-10-CM | POA: Diagnosis not present

## 2018-10-20 DIAGNOSIS — J9621 Acute and chronic respiratory failure with hypoxia: Secondary | ICD-10-CM | POA: Diagnosis not present

## 2018-10-20 DIAGNOSIS — J181 Lobar pneumonia, unspecified organism: Secondary | ICD-10-CM | POA: Diagnosis not present

## 2018-10-20 DIAGNOSIS — I609 Nontraumatic subarachnoid hemorrhage, unspecified: Secondary | ICD-10-CM | POA: Diagnosis not present

## 2018-10-20 NOTE — Progress Notes (Signed)
Pulmonary Critical Care Medicine [place]   PULMONARY CRITICAL CARE SERVICE  PROGRESS NOTE  Date of Service: 10/20/2018  Carlos Aguilar  [id number]  DOB: November 01, 1945   DOA: 10/07/2018  Referring Physician: Merton Border, MD  HPI: Carlos Aguilar is a 73 y.o. male seen for follow up of Acute on Chronic Respiratory Failure.  Patient is currently on T collar has been on 28% FiO2 secretions are reported to be moderate  Medications: Reviewed on Rounds  Physical Exam:  Vitals: Temperature 96.6 pulse 76 respiratory rate 18 blood pressure 123/71 saturations 98%  Ventilator Settings patient is currently off the ventilator on T collar has been on 28% FiO2  . General: Comfortable at this time . Eyes: Grossly normal lids, irises & conjunctiva . ENT: grossly tongue is normal . Neck: no obvious mass . Cardiovascular: S1 S2 normal no gallop . Respiratory: No rhonchi no rales are noted at this time . Abdomen: soft . Skin: no rash seen on limited exam . Musculoskeletal: not rigid . Psychiatric:unable to assess . Neurologic: no seizure no involuntary movements         Lab Data:   Basic Metabolic Panel: Recent Labs  Lab 10/15/18 0714 10/17/18 0707 10/19/18 0558  NA 135 134* 131*  K 4.1 3.8 3.8  CL 96* 94* 93*  CO2 29 30 28   GLUCOSE 127* 137* 114*  BUN 7* 8 8  CREATININE 0.51* 0.50* 0.46*  CALCIUM 9.1 9.1 9.0    ABG: No results for input(s): PHART, PCO2ART, PO2ART, HCO3, O2SAT in the last 168 hours.  Liver Function Tests: No results for input(s): AST, ALT, ALKPHOS, BILITOT, PROT, ALBUMIN in the last 168 hours. No results for input(s): LIPASE, AMYLASE in the last 168 hours. No results for input(s): AMMONIA in the last 168 hours.  CBC: Recent Labs  Lab 10/17/18 0707  WBC 7.1  HGB 9.7*  HCT 31.2*  MCV 89.4  PLT 127*    Cardiac Enzymes: No results for input(s): CKTOTAL, CKMB, CKMBINDEX, TROPONINI in the last 168 hours.  BNP (last 3 results) No  results for input(s): BNP in the last 8760 hours.  ProBNP (last 3 results) No results for input(s): PROBNP in the last 8760 hours.  Radiological Exams: No results found.  Assessment/Plan Active Problems:   Acute on chronic respiratory failure with hypoxia (HCC)   Subarachnoid hemorrhage, nontraumatic (HCC)   Lobar pneumonia, unspecified organism ([place])   Tracheostomy status ([place])   1. Acute on chronic respiratory failure with hypoxia remains on T collar right now is on 28% FiO2 secretions are still moderate so we will follow along hold off on advancing to capping at this point. 2. Subarachnoid hemorrhage unchanged we will continue to follow 3. Lobar pneumonia treated we will continue with supportive care 4. Tracheostomy status we will continue with supportive care   I have personally seen and evaluated the patient, evaluated laboratory and imaging results, formulated the assessment and plan and placed orders. The Patient requires high complexity decision making for assessment and support.  Case was discussed on Rounds with the Respiratory Therapy Staff  Allyne Gee, MD [facility] Pulmonary Critical Care Medicine Sleep Medicine

## 2018-10-21 DIAGNOSIS — J181 Lobar pneumonia, unspecified organism: Secondary | ICD-10-CM | POA: Diagnosis not present

## 2018-10-21 DIAGNOSIS — I609 Nontraumatic subarachnoid hemorrhage, unspecified: Secondary | ICD-10-CM | POA: Diagnosis not present

## 2018-10-21 DIAGNOSIS — Z930 Tracheostomy status: Secondary | ICD-10-CM | POA: Diagnosis not present

## 2018-10-21 DIAGNOSIS — J9621 Acute and chronic respiratory failure with hypoxia: Secondary | ICD-10-CM | POA: Diagnosis not present

## 2018-10-21 LAB — BASIC METABOLIC PANEL
Anion gap: 10 (ref 5–15)
BUN: 10 mg/dL (ref 8–23)
CO2: 27 mmol/L (ref 22–32)
Calcium: 9.1 mg/dL (ref 8.9–10.3)
Chloride: 93 mmol/L — ABNORMAL LOW (ref 98–111)
Creatinine, Ser: 0.49 mg/dL — ABNORMAL LOW (ref 0.61–1.24)
GFR calc Af Amer: >60 mL/min (ref >60)
GFR calc non Af Amer: >60 mL/min (ref >60)
Glucose, Bld: 118 mg/dL — ABNORMAL HIGH (ref 70–99)
Potassium: 5.7 mmol/L — ABNORMAL HIGH (ref 3.5–5.1)
Sodium: 130 mmol/L — ABNORMAL LOW (ref 135–145)

## 2018-10-21 LAB — MAGNESIUM: Magnesium: 1.5 mg/dL — ABNORMAL LOW (ref 1.7–2.4)

## 2018-10-21 NOTE — Progress Notes (Signed)
Pulmonary Critical Care Medicine [place]   PULMONARY CRITICAL CARE SERVICE  PROGRESS NOTE  Date of Service: 10/21/2018  Carlos Aguilar  [id number]  DOB: August 04, 1945   DOA: 10/07/2018  Referring Physician: Merton Border, MD  HPI: Carlos Aguilar is a 73 y.o. male seen for follow up of Acute on Chronic Respiratory Failure.  Patient currently is on T collar seems to be comfortable without distress at this time.  Has been on 20% FiO2 secretions are reportedly improving  Medications: Reviewed on Rounds  Physical Exam:  Vitals: Temperature 98.9 pulse 97 respiratory 20 blood pressure 140/86 saturations 98%  Ventilator Settings off the ventilator right now on T collar  . General: Comfortable at this time . Eyes: Grossly normal lids, irises & conjunctiva . ENT: grossly tongue is normal . Neck: no obvious mass . Cardiovascular: S1 S2 normal no gallop . Respiratory: No rhonchi no rales are noted at this time . Abdomen: soft . Skin: no rash seen on limited exam . Musculoskeletal: not rigid . Psychiatric:unable to assess . Neurologic: no seizure no involuntary movements         Lab Data:   Basic Metabolic Panel: Recent Labs  Lab 10/15/18 0714 10/17/18 0707 10/19/18 0558 10/21/18 0546  NA 135 134* 131*  --   K 4.1 3.8 3.8  --   CL 96* 94* 93*  --   CO2 29 30 28   --   GLUCOSE 127* 137* 114*  --   BUN 7* 8 8  --   CREATININE 0.51* 0.50* 0.46*  --   CALCIUM 9.1 9.1 9.0  --   MG  --   --   --  1.5*    ABG: No results for input(s): PHART, PCO2ART, PO2ART, HCO3, O2SAT in the last 168 hours.  Liver Function Tests: No results for input(s): AST, ALT, ALKPHOS, BILITOT, PROT, ALBUMIN in the last 168 hours. No results for input(s): LIPASE, AMYLASE in the last 168 hours. No results for input(s): AMMONIA in the last 168 hours.  CBC: Recent Labs  Lab 10/17/18 0707  WBC 7.1  HGB 9.7*  HCT 31.2*  MCV 89.4  PLT 127*    Cardiac Enzymes: No results for  input(s): CKTOTAL, CKMB, CKMBINDEX, TROPONINI in the last 168 hours.  BNP (last 3 results) No results for input(s): BNP in the last 8760 hours.  ProBNP (last 3 results) No results for input(s): PROBNP in the last 8760 hours.  Radiological Exams: No results found.  Assessment/Plan Active Problems:   Acute on chronic respiratory failure with hypoxia (HCC)   Subarachnoid hemorrhage, nontraumatic (HCC)   Lobar pneumonia, unspecified organism ([place])   Tracheostomy status ([place])   1. Acute on chronic respiratory failure with hypoxia we will continue with T collar trials currently is on 28% FiO2 continue aggressive pulmonary toilet secretion management supportive care 2. Subarachnoid hemorrhage nontraumatic at baseline we will continue to follow 3. Lobar pneumonia treated clinically improving 4. Tracheostomy unchanged we will continue with supportive care   I have personally seen and evaluated the patient, evaluated laboratory and imaging results, formulated the assessment and plan and placed orders. The Patient requires high complexity decision making for assessment and support.  Case was discussed on Rounds with the Respiratory Therapy Staff  Allyne Gee, MD [facility] Pulmonary Critical Care Medicine Sleep Medicine

## 2018-10-22 DIAGNOSIS — J9621 Acute and chronic respiratory failure with hypoxia: Secondary | ICD-10-CM | POA: Diagnosis not present

## 2018-10-22 DIAGNOSIS — J181 Lobar pneumonia, unspecified organism: Secondary | ICD-10-CM | POA: Diagnosis not present

## 2018-10-22 DIAGNOSIS — Z930 Tracheostomy status: Secondary | ICD-10-CM | POA: Diagnosis not present

## 2018-10-22 DIAGNOSIS — I609 Nontraumatic subarachnoid hemorrhage, unspecified: Secondary | ICD-10-CM | POA: Diagnosis not present

## 2018-10-22 LAB — CBC
HCT: 31.7 % — ABNORMAL LOW (ref 39.0–52.0)
Hemoglobin: 10.2 g/dL — ABNORMAL LOW (ref 13.0–17.0)
MCH: 28.3 pg (ref 26.0–34.0)
MCHC: 32.2 g/dL (ref 30.0–36.0)
MCV: 88.1 fL (ref 80.0–100.0)
Platelets: 106 10*3/uL — ABNORMAL LOW (ref 150–400)
RBC: 3.6 MIL/uL — ABNORMAL LOW (ref 4.22–5.81)
RDW: 14.5 % (ref 11.5–15.5)
WBC: 6.9 10*3/uL (ref 4.0–10.5)
nRBC: 0 % (ref 0.0–0.2)

## 2018-10-22 LAB — BASIC METABOLIC PANEL
Anion gap: 10 (ref 5–15)
BUN: 9 mg/dL (ref 8–23)
CO2: 30 mmol/L (ref 22–32)
Calcium: 9.3 mg/dL (ref 8.9–10.3)
Chloride: 92 mmol/L — ABNORMAL LOW (ref 98–111)
Creatinine, Ser: 0.5 mg/dL — ABNORMAL LOW (ref 0.61–1.24)
GFR calc Af Amer: >60 mL/min (ref >60)
GFR calc non Af Amer: >60 mL/min (ref >60)
Glucose, Bld: 121 mg/dL — ABNORMAL HIGH (ref 70–99)
Potassium: 3.9 mmol/L (ref 3.5–5.1)
Sodium: 132 mmol/L — ABNORMAL LOW (ref 135–145)

## 2018-10-22 LAB — MAGNESIUM: Magnesium: 1.7 mg/dL (ref 1.7–2.4)

## 2018-10-22 NOTE — Progress Notes (Addendum)
Pulmonary Critical Care Medicine [place]   PULMONARY CRITICAL CARE SERVICE  PROGRESS NOTE  Date of Service: 10/22/2018  Carlos Aguilar  [id number]  DOB: 02-01-1945   DOA: 10/07/2018  Referring Physician: Merton Border, MD  HPI: Carlos Aguilar is a 73 y.o. male seen for follow up of Acute on Chronic Respiratory Failure.  Patient remains on 28% aerosol trach collar at this time satting well with no distress.  Medications: Reviewed on Rounds  Physical Exam:  Vitals: Pulse 90 respirations 19 BP 119/28 O2 sat 90% temp 96 point  Ventilator Settings ATC 28%  . General: Comfortable at this time . Eyes: Grossly normal lids, irises & conjunctiva . ENT: grossly tongue is normal . Neck: no obvious mass . Cardiovascular: S1 S2 normal no gallop . Respiratory: No rales or rhonchi noted . Abdomen: soft . Skin: no rash seen on limited exam . Musculoskeletal: not rigid . Psychiatric:unable to assess . Neurologic: no seizure no involuntary movements         Lab Data:   Basic Metabolic Panel: Recent Labs  Lab 10/17/18 0707 10/19/18 0558 10/21/18 0546 10/21/18 1605 10/22/18 0535  NA 134* 131*  --  130* 132*  K 3.8 3.8  --  5.7* 3.9  CL 94* 93*  --  93* 92*  CO2 30 28  --  27 30  GLUCOSE 137* 114*  --  118* 121*  BUN 8 8  --  10 9  CREATININE 0.50* 0.46*  --  0.49* 0.50*  CALCIUM 9.1 9.0  --  9.1 9.3  MG  --   --  1.5*  --  1.7    ABG: No results for input(s): PHART, PCO2ART, PO2ART, HCO3, O2SAT in the last 168 hours.  Liver Function Tests: No results for input(s): AST, ALT, ALKPHOS, BILITOT, PROT, ALBUMIN in the last 168 hours. No results for input(s): LIPASE, AMYLASE in the last 168 hours. No results for input(s): AMMONIA in the last 168 hours.  CBC: Recent Labs  Lab 10/17/18 0707 10/22/18 0535  WBC 7.1 6.9  HGB 9.7* 10.2*  HCT 31.2* 31.7*  MCV 89.4 88.1  PLT 127* 106*    Cardiac Enzymes: No results for input(s): CKTOTAL, CKMB, CKMBINDEX,  TROPONINI in the last 168 hours.  BNP (last 3 results) No results for input(s): BNP in the last 8760 hours.  ProBNP (last 3 results) No results for input(s): PROBNP in the last 8760 hours.  Radiological Exams: No results found.  Assessment/Plan Active Problems:   Acute on chronic respiratory failure with hypoxia (HCC)   Subarachnoid hemorrhage, nontraumatic (HCC)   Lobar pneumonia, unspecified organism ([place])   Tracheostomy status ([employer])   1. Acute on chronic respiratory failure with hypoxia we will continue with T collar trials currently is on 28% FiO2 continue aggressive pulmonary toilet secretion management supportive care 2. Subarachnoid hemorrhage nontraumatic at baseline we will continue to follow 3. Lobar pneumonia treated clinically improving 4. Tracheostomy unchanged we will continue with supportive care   I have personally seen and evaluated the patient, evaluated laboratory and imaging results, formulated the assessment and plan and placed orders. The Patient requires high complexity decision making for assessment and support.  Case was discussed on Rounds with the Respiratory Therapy Staff  Allyne Gee, MD [facility] Pulmonary Critical Care Medicine Sleep Medicine

## 2018-10-23 DIAGNOSIS — J9621 Acute and chronic respiratory failure with hypoxia: Secondary | ICD-10-CM | POA: Diagnosis not present

## 2018-10-23 DIAGNOSIS — J181 Lobar pneumonia, unspecified organism: Secondary | ICD-10-CM | POA: Diagnosis not present

## 2018-10-23 DIAGNOSIS — Z930 Tracheostomy status: Secondary | ICD-10-CM | POA: Diagnosis not present

## 2018-10-23 DIAGNOSIS — I609 Nontraumatic subarachnoid hemorrhage, unspecified: Secondary | ICD-10-CM | POA: Diagnosis not present

## 2018-10-23 NOTE — Progress Notes (Addendum)
Pulmonary Critical Care Medicine [place]   PULMONARY CRITICAL CARE SERVICE  PROGRESS NOTE  Date of Service: 10/23/2018  Carlos Aguilar  [id number]  DOB: 1945-11-05   DOA: 10/07/2018  Referring Physician: Merton Border, MD  HPI: Carlos Aguilar is a 73 y.o. male seen for follow up of Acute on Chronic Respiratory Failure.  Patient remains on 28% aerosol trach collar satting well with no fever or distress.  Medications: Reviewed on Rounds  Physical Exam:  Vitals: Pulse 78 respirations 19 BP 117/59 O2 sat 96% temp 97.9  Ventilator Settings 28% ATC  . General: Comfortable at this time . Eyes: Grossly normal lids, irises & conjunctiva . ENT: grossly tongue is normal . Neck: no obvious mass . Cardiovascular: S1 S2 normal no gallop . Respiratory: No rales or rhonchi noted . Abdomen: soft . Skin: no rash seen on limited exam . Musculoskeletal: not rigid . Psychiatric:unable to assess . Neurologic: no seizure no involuntary movements         Lab Data:   Basic Metabolic Panel: Recent Labs  Lab 10/17/18 0707 10/19/18 0558 10/21/18 0546 10/21/18 1605 10/22/18 0535  NA 134* 131*  --  130* 132*  K 3.8 3.8  --  5.7* 3.9  CL 94* 93*  --  93* 92*  CO2 30 28  --  27 30  GLUCOSE 137* 114*  --  118* 121*  BUN 8 8  --  10 9  CREATININE 0.50* 0.46*  --  0.49* 0.50*  CALCIUM 9.1 9.0  --  9.1 9.3  MG  --   --  1.5*  --  1.7    ABG: No results for input(s): PHART, PCO2ART, PO2ART, HCO3, O2SAT in the last 168 hours.  Liver Function Tests: No results for input(s): AST, ALT, ALKPHOS, BILITOT, PROT, ALBUMIN in the last 168 hours. No results for input(s): LIPASE, AMYLASE in the last 168 hours. No results for input(s): AMMONIA in the last 168 hours.  CBC: Recent Labs  Lab 10/17/18 0707 10/22/18 0535  WBC 7.1 6.9  HGB 9.7* 10.2*  HCT 31.2* 31.7*  MCV 89.4 88.1  PLT 127* 106*    Cardiac Enzymes: No results for input(s): CKTOTAL, CKMB, CKMBINDEX, TROPONINI  in the last 168 hours.  BNP (last 3 results) No results for input(s): BNP in the last 8760 hours.  ProBNP (last 3 results) No results for input(s): PROBNP in the last 8760 hours.  Radiological Exams: No results found.  Assessment/Plan Active Problems:   Acute on chronic respiratory failure with hypoxia (HCC)   Subarachnoid hemorrhage, nontraumatic (HCC)   Lobar pneumonia, unspecified organism ([place])   Tracheostomy status ([place])   1. Acute on chronic respiratory failure with hypoxia we will continue with T collar trials currently is on 28% FiO2 continue aggressive pulmonary toilet secretion management supportive care 2. Subarachnoid hemorrhage nontraumatic at baseline we will continue to follow 3. Lobar pneumonia treated clinically improving 4. Tracheostomy unchanged we will continue with supportive care   I have personally seen and evaluated the patient, evaluated laboratory and imaging results, formulated the assessment and plan and placed orders. The Patient requires high complexity decision making for assessment and support.  Case was discussed on Rounds with the Respiratory Therapy Staff  Allyne Gee, MD [facility] Pulmonary Critical Care Medicine Sleep Medicine

## 2018-10-24 DIAGNOSIS — J9621 Acute and chronic respiratory failure with hypoxia: Secondary | ICD-10-CM | POA: Diagnosis not present

## 2018-10-24 DIAGNOSIS — I609 Nontraumatic subarachnoid hemorrhage, unspecified: Secondary | ICD-10-CM | POA: Diagnosis not present

## 2018-10-24 DIAGNOSIS — J181 Lobar pneumonia, unspecified organism: Secondary | ICD-10-CM | POA: Diagnosis not present

## 2018-10-24 DIAGNOSIS — Z930 Tracheostomy status: Secondary | ICD-10-CM | POA: Diagnosis not present

## 2018-10-24 NOTE — Progress Notes (Addendum)
Pulmonary Critical Care Medicine [place]   PULMONARY CRITICAL CARE SERVICE  PROGRESS NOTE  Date of Service: 10/24/2018  Carlos Aguilar  [id number]:[id number]  DOB: September 03, 1945   DOA: 10/07/2018  Referring Physician: Merton Border, MD  HPI: Carlos Aguilar is a 73 y.o. male seen for follow up of Acute on Chronic Respiratory Failure.  Patient is currently on aerosol trach collar 28% FiO2.  Reports patient will be leaving facility to be on comfort care.  Medications: Reviewed on Rounds  Physical Exam:  Vitals: Pulse 94 respirations 20 BP 160/60 O2 sat 1% temp 6.1  Ventilator Settings ATC 28%  . General: Comfortable at this time . Eyes: Grossly normal lids, irises & conjunctiva . ENT: grossly tongue is normal . Neck: no obvious mass . Cardiovascular: S1 S2 normal no gallop . Respiratory: No rales or rhonchi noted . Abdomen: soft . Skin: no rash seen on limited exam . Musculoskeletal: not rigid . Psychiatric:unable to assess . Neurologic: no seizure no involuntary movements         Lab Data:   Basic Metabolic Panel: Recent Labs  Lab 10/19/18 0558 10/21/18 0546 10/21/18 1605 10/22/18 0535  NA 131*  --  130* 132*  K 3.8  --  5.7* 3.9  CL 93*  --  93* 92*  CO2 28  --  27 30  GLUCOSE 114*  --  118* 121*  BUN 8  --  10 9  CREATININE 0.46*  --  0.49* 0.50*  CALCIUM 9.0  --  9.1 9.3  MG  --  1.5*  --  1.7    ABG: No results for input(s): PHART, PCO2ART, PO2ART, HCO3, O2SAT in the last 168 hours.  Liver Function Tests: No results for input(s): AST, ALT, ALKPHOS, BILITOT, PROT, ALBUMIN in the last 168 hours. No results for input(s): LIPASE, AMYLASE in the last 168 hours. No results for input(s): AMMONIA in the last 168 hours.  CBC: Recent Labs  Lab 10/22/18 0535  WBC 6.9  HGB 10.2*  HCT 31.7*  MCV 88.1  PLT 106*    Cardiac Enzymes: No results for input(s): CKTOTAL, CKMB, CKMBINDEX, TROPONINI in the last 168 hours.  BNP (last 3 results) No results  for input(s): BNP in the last 8760 hours.  ProBNP (last 3 results) No results for input(s): PROBNP in the last 8760 hours.  Radiological Exams: No results found.  Assessment/Plan Active Problems:   Acute on chronic respiratory failure with hypoxia (HCC)   Subarachnoid hemorrhage, nontraumatic (HCC)   Lobar pneumonia, unspecified organism ([place])   Tracheostomy status ([place])   1. Acute on chronic respiratory failure with hypoxia we will continue with T collar trials currently is on 28% FiO2 continue aggressive pulmonary toilet secretion management supportive care 2. Subarachnoid hemorrhage nontraumatic at baseline we will continue to follow 3. Lobar pneumonia treated clinically improving 4. Tracheostomy unchanged we will continue with supportive care   I have personally seen and evaluated the patient, evaluated laboratory and imaging results, formulated the assessment and plan and placed orders. The Patient requires high complexity decision making for assessment and support.  Case was discussed on Rounds with the Respiratory Therapy Staff  Allyne Gee, MD [facility] Pulmonary Critical Care Medicine Sleep Medicine

## 2018-10-25 DIAGNOSIS — Z930 Tracheostomy status: Secondary | ICD-10-CM | POA: Diagnosis not present

## 2018-10-25 DIAGNOSIS — J9621 Acute and chronic respiratory failure with hypoxia: Secondary | ICD-10-CM | POA: Diagnosis not present

## 2018-10-25 DIAGNOSIS — J181 Lobar pneumonia, unspecified organism: Secondary | ICD-10-CM | POA: Diagnosis not present

## 2018-10-25 DIAGNOSIS — I609 Nontraumatic subarachnoid hemorrhage, unspecified: Secondary | ICD-10-CM | POA: Diagnosis not present

## 2018-10-25 NOTE — Progress Notes (Addendum)
Pulmonary Critical Care Medicine [place]   PULMONARY CRITICAL CARE SERVICE  PROGRESS NOTE  Date of Service: 10/25/2018  Carlos Aguilar  [id number]:[id number]  DOB: 1945-09-16   DOA: 10/07/2018  Referring Physician: Merton Border, MD  HPI: Carlos Aguilar is a 73 y.o. male seen for follow up of Acute on Chronic Respiratory Failure.  Patient mains on 20% aerosol trach collar satting well no fever  Medications: Reviewed on Rounds  Physical Exam:  Vitals: Pulse 77 respirations 18 BP 130/88 O2 sat 99% on 3  Ventilator Settings ATC 28%  . General: Comfortable at this time . Eyes: Grossly normal lids, irises & conjunctiva . ENT: grossly tongue is normal . Neck: no obvious mass . Cardiovascular: S1 S2 normal no gallop . Respiratory: No rales or rhonchi noted . Abdomen: soft . Skin: no rash seen on limited exam . Musculoskeletal: not rigid . Psychiatric:unable to assess . Neurologic: no seizure no involuntary movements         Lab Data:   Basic Metabolic Panel: Recent Labs  Lab 10/19/18 0558 10/21/18 0546 10/21/18 1605 10/22/18 0535  NA 131*  --  130* 132*  K 3.8  --  5.7* 3.9  CL 93*  --  93* 92*  CO2 28  --  27 30  GLUCOSE 114*  --  118* 121*  BUN 8  --  10 9  CREATININE 0.46*  --  0.49* 0.50*  CALCIUM 9.0  --  9.1 9.3  MG  --  1.5*  --  1.7    ABG: No results for input(s): PHART, PCO2ART, PO2ART, HCO3, O2SAT in the last 168 hours.  Liver Function Tests: No results for input(s): AST, ALT, ALKPHOS, BILITOT, PROT, ALBUMIN in the last 168 hours. No results for input(s): LIPASE, AMYLASE in the last 168 hours. No results for input(s): AMMONIA in the last 168 hours.  CBC: Recent Labs  Lab 10/22/18 0535  WBC 6.9  HGB 10.2*  HCT 31.7*  MCV 88.1  PLT 106*    Cardiac Enzymes: No results for input(s): CKTOTAL, CKMB, CKMBINDEX, TROPONINI in the last 168 hours.  BNP (last 3 results) No results for input(s): BNP in the last 8760 hours.  ProBNP (last 3  results) No results for input(s): PROBNP in the last 8760 hours.  Radiological Exams: No results found.  Assessment/Plan Active Problems:   Acute on chronic respiratory failure with hypoxia (HCC)   Subarachnoid hemorrhage, nontraumatic (HCC)   Lobar pneumonia, unspecified organism ([place])   Tracheostomy status ([place])   1. Acute on chronic respiratory failure with hypoxia we will continue with T collar trials currently is on 28% FiO2 continue aggressive pulmonary toilet secretion management supportive care 2. Subarachnoid hemorrhage nontraumatic at baseline we will continue to follow 3. Lobar pneumonia treated clinically improving 4. Tracheostomy unchanged we will continue with supportive care   I have personally seen and evaluated the patient, evaluated laboratory and imaging results, formulated the assessment and plan and placed orders. The Patient requires high complexity decision making for assessment and support.  Case was discussed on Rounds with the Respiratory Therapy Staff  Allyne Gee, MD [facility] Pulmonary Critical Care Medicine Sleep Medicine

## 2018-10-26 DIAGNOSIS — J9621 Acute and chronic respiratory failure with hypoxia: Secondary | ICD-10-CM | POA: Diagnosis not present

## 2018-10-26 DIAGNOSIS — J181 Lobar pneumonia, unspecified organism: Secondary | ICD-10-CM | POA: Diagnosis not present

## 2018-10-26 DIAGNOSIS — I609 Nontraumatic subarachnoid hemorrhage, unspecified: Secondary | ICD-10-CM | POA: Diagnosis not present

## 2018-10-26 DIAGNOSIS — Z930 Tracheostomy status: Secondary | ICD-10-CM | POA: Diagnosis not present

## 2018-10-26 NOTE — Progress Notes (Signed)
Pulmonary Critical Care Medicine [place]   PULMONARY CRITICAL CARE SERVICE  PROGRESS NOTE  Date of Service: 10/26/2018  Carlos Aguilar  NOM:[id number]  DOB: 01-Mar-1945   DOA: 10/07/2018  Referring Physician: Merton Border, MD  HPI: Carlos Aguilar is a 73 y.o. male seen for follow up of Acute on Chronic Respiratory Failure.  Patient is on T collar currently on 28% FiO2 good saturations are noted at this time  Medications: Reviewed on Rounds  Physical Exam:  Vitals: Temperature 97.1 pulse 86 respiratory rate 21 blood pressure is 118/60 saturations 100%  Ventilator Settings off the ventilator right now on T collar  . General: Comfortable at this time . Eyes: Grossly normal lids, irises & conjunctiva . ENT: grossly tongue is normal . Neck: no obvious mass . Cardiovascular: S1 S2 normal no gallop . Respiratory: No rhonchi coarse breath sounds are noted . Abdomen: soft . Skin: no rash seen on limited exam . Musculoskeletal: not rigid . Psychiatric:unable to assess . Neurologic: no seizure no involuntary movements         Lab Data:   Basic Metabolic Panel: Recent Labs  Lab 10/21/18 0546 10/21/18 1605 10/22/18 0535  NA  --  130* 132*  K  --  5.7* 3.9  CL  --  93* 92*  CO2  --  27 30  GLUCOSE  --  118* 121*  BUN  --  10 9  CREATININE  --  0.49* 0.50*  CALCIUM  --  9.1 9.3  MG 1.5*  --  1.7    ABG: No results for input(s): PHART, PCO2ART, PO2ART, HCO3, O2SAT in the last 168 hours.  Liver Function Tests: No results for input(s): AST, ALT, ALKPHOS, BILITOT, PROT, ALBUMIN in the last 168 hours. No results for input(s): LIPASE, AMYLASE in the last 168 hours. No results for input(s): AMMONIA in the last 168 hours.  CBC: Recent Labs  Lab 10/22/18 0535  WBC 6.9  HGB 10.2*  HCT 31.7*  MCV 88.1  PLT 106*    Cardiac Enzymes: No results for input(s): CKTOTAL, CKMB, CKMBINDEX, TROPONINI in the last 168 hours.  BNP (last 3 results) No results for  input(s): BNP in the last 8760 hours.  ProBNP (last 3 results) No results for input(s): PROBNP in the last 8760 hours.  Radiological Exams: No results found.  Assessment/Plan Active Problems:   Acute on chronic respiratory failure with hypoxia (HCC)   Subarachnoid hemorrhage, nontraumatic (HCC)   Lobar pneumonia, unspecified organism ([place])   Tracheostomy status ([place])   1. Acute on chronic respiratory failure with hypoxia we will continue with T collar trials titrate oxygen continue pulmonary toilet. 2. Subarachnoid hemorrhage nontraumatic we will continue with supportive care 3. Lobar pneumonia treated 4. 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 The Patient requires high complexity decision making for assessment and support.  Case was discussed on Rounds with the Respiratory Therapy Staff  Allyne Gee, MD [facility] Pulmonary Critical Care Medicine Sleep Medicine

## 2018-10-27 DIAGNOSIS — Z930 Tracheostomy status: Secondary | ICD-10-CM | POA: Diagnosis not present

## 2018-10-27 DIAGNOSIS — J181 Lobar pneumonia, unspecified organism: Secondary | ICD-10-CM | POA: Diagnosis not present

## 2018-10-27 DIAGNOSIS — J9621 Acute and chronic respiratory failure with hypoxia: Secondary | ICD-10-CM | POA: Diagnosis not present

## 2018-10-27 DIAGNOSIS — I609 Nontraumatic subarachnoid hemorrhage, unspecified: Secondary | ICD-10-CM | POA: Diagnosis not present

## 2018-10-27 NOTE — Progress Notes (Signed)
Pulmonary Critical Care Medicine [place]   PULMONARY CRITICAL CARE SERVICE  PROGRESS NOTE  Date of Service: 10/27/2018  Carlos Aguilar  [id number]  DOB: [birth year]   DOA: 10/07/2018  Referring Physician: Merton Border, MD  HPI: Carlos Aguilar is a 73 y.o. male seen for follow up of Acute on Chronic Respiratory Failure.  Right now patient is on T collar doing about the same secretions are still copious  Medications: Reviewed on Rounds  Physical Exam:  Vitals: Temperature 97.2 pulse 85 respiratory rate 20 blood pressure 114/68 saturations 100%  Ventilator Settings off the ventilator right now on T collar  . General: Comfortable at this time . Eyes: Grossly normal lids, irises & conjunctiva . ENT: grossly tongue is normal . Neck: no obvious mass . Cardiovascular: S1 S2 normal no gallop . Respiratory: No rhonchi no rales are noted . Abdomen: soft . Skin: no rash seen on limited exam . Musculoskeletal: not rigid . Psychiatric:unable to assess . Neurologic: no seizure no involuntary movements         Lab Data:   Basic Metabolic Panel: Recent Labs  Lab 10/21/18 0546 10/21/18 1605 10/22/18 0535  NA  --  130* 132*  K  --  5.7* 3.9  CL  --  93* 92*  CO2  --  27 30  GLUCOSE  --  118* 121*  BUN  --  10 9  CREATININE  --  0.49* 0.50*  CALCIUM  --  9.1 9.3  MG 1.5*  --  1.7    ABG: No results for input(s): PHART, PCO2ART, PO2ART, HCO3, O2SAT in the last 168 hours.  Liver Function Tests: No results for input(s): AST, ALT, ALKPHOS, BILITOT, PROT, ALBUMIN in the last 168 hours. No results for input(s): LIPASE, AMYLASE in the last 168 hours. No results for input(s): AMMONIA in the last 168 hours.  CBC: Recent Labs  Lab 10/22/18 0535  WBC 6.9  HGB 10.2*  HCT 31.7*  MCV 88.1  PLT 106*    Cardiac Enzymes: No results for input(s): CKTOTAL, CKMB, CKMBINDEX, TROPONINI in the last 168 hours.  BNP (last 3 results) No results for input(s): BNP in  the last 8760 hours.  ProBNP (last 3 results) No results for input(s): PROBNP in the last 8760 hours.  Radiological Exams: No results found.  Assessment/Plan Active Problems:   Acute on chronic respiratory failure with hypoxia (HCC)   Subarachnoid hemorrhage, nontraumatic (HCC)   Lobar pneumonia, unspecified organism ([place])   Tracheostomy status ([place])   1. Acute on chronic respiratory failure with hypoxia we will continue with T collar trials wean as tolerated 2. Subarachnoid hemorrhage unchanged we will continue with supportive care 3. Lobar pneumonia treated 4. Tracheostomy remains in place   I have personally seen and evaluated the patient, evaluated laboratory and imaging results, formulated the assessment and plan and placed orders. The Patient requires high complexity decision making for assessment and support.  Case was discussed on Rounds with the Respiratory Therapy Staff  Allyne Gee, MD [facility] Pulmonary Critical Care Medicine Sleep Medicine

## 2018-10-28 DIAGNOSIS — J181 Lobar pneumonia, unspecified organism: Secondary | ICD-10-CM | POA: Diagnosis not present

## 2018-10-28 DIAGNOSIS — Z930 Tracheostomy status: Secondary | ICD-10-CM | POA: Diagnosis not present

## 2018-10-28 DIAGNOSIS — J9621 Acute and chronic respiratory failure with hypoxia: Secondary | ICD-10-CM | POA: Diagnosis not present

## 2018-10-28 DIAGNOSIS — I609 Nontraumatic subarachnoid hemorrhage, unspecified: Secondary | ICD-10-CM | POA: Diagnosis not present

## 2018-10-28 NOTE — Progress Notes (Addendum)
Pulmonary Critical Care Medicine [place]   PULMONARY CRITICAL CARE SERVICE  PROGRESS NOTE  Date of Service: 10/28/2018  Brentley Landfair  [id number]  DOB: 1945/10/07   DOA: 10/07/2018  Referring Physician: Merton Border, MD  HPI: Carlos Aguilar is a 73 y.o. male seen for follow up of Acute on Chronic Respiratory Failure.  Patient is on a trach collar 20% FiO2 satting well no fever or distress.  Medications: Reviewed on Rounds  Physical Exam:  Vitals: Pulse 76 respirations 21 BP 136/73 O2 sat 90% temp 98.8  Ventilator Settings 20% ATC  . General: Comfortable at this time . Eyes: Grossly normal lids, irises & conjunctiva . ENT: grossly tongue is normal . Neck: no obvious mass . Cardiovascular: S1 S2 normal no gallop . Respiratory: No rales or rhonchi noted . Abdomen: soft . Skin: no rash seen on limited exam . Musculoskeletal: not rigid . Psychiatric:unable to assess . Neurologic: no seizure no involuntary movements         Lab Data:   Basic Metabolic Panel: Recent Labs  Lab 10/22/18 0535  NA 132*  K 3.9  CL 92*  CO2 30  GLUCOSE 121*  BUN 9  CREATININE 0.50*  CALCIUM 9.3  MG 1.7    ABG: No results for input(s): PHART, PCO2ART, PO2ART, HCO3, O2SAT in the last 168 hours.  Liver Function Tests: No results for input(s): AST, ALT, ALKPHOS, BILITOT, PROT, ALBUMIN in the last 168 hours. No results for input(s): LIPASE, AMYLASE in the last 168 hours. No results for input(s): AMMONIA in the last 168 hours.  CBC: Recent Labs  Lab 10/22/18 0535  WBC 6.9  HGB 10.2*  HCT 31.7*  MCV 88.1  PLT 106*    Cardiac Enzymes: No results for input(s): CKTOTAL, CKMB, CKMBINDEX, TROPONINI in the last 168 hours.  BNP (last 3 results) No results for input(s): BNP in the last 8760 hours.  ProBNP (last 3 results) No results for input(s): PROBNP in the last 8760 hours.  Radiological Exams: No results found.  Assessment/Plan Active Problems:   Acute  on chronic respiratory failure with hypoxia (HCC)   Subarachnoid hemorrhage, nontraumatic (HCC)   Lobar pneumonia, unspecified organism ([place])   Tracheostomy status ([place])   1. Acute on chronic respiratory failure with hypoxia we will continue with T collar trials wean as tolerated 2. Subarachnoid hemorrhage unchanged we will continue with supportive care 3. Lobar pneumonia treated 4. Tracheostomy remains in place   I have personally seen and evaluated the patient, evaluated laboratory and imaging results, formulated the assessment and plan and placed orders. The Patient requires high complexity decision making for assessment and support.  Case was discussed on Rounds with the Respiratory Therapy Staff  Allyne Gee, MD [facility] Pulmonary Critical Care Medicine Sleep Medicine

## 2018-10-29 DIAGNOSIS — I609 Nontraumatic subarachnoid hemorrhage, unspecified: Secondary | ICD-10-CM | POA: Diagnosis not present

## 2018-10-29 DIAGNOSIS — J181 Lobar pneumonia, unspecified organism: Secondary | ICD-10-CM | POA: Diagnosis not present

## 2018-10-29 DIAGNOSIS — Z930 Tracheostomy status: Secondary | ICD-10-CM | POA: Diagnosis not present

## 2018-10-29 DIAGNOSIS — J9621 Acute and chronic respiratory failure with hypoxia: Secondary | ICD-10-CM | POA: Diagnosis not present

## 2018-10-29 LAB — CBC
HCT: 32.9 % — ABNORMAL LOW (ref 39.0–52.0)
Hemoglobin: 10.4 g/dL — ABNORMAL LOW (ref 13.0–17.0)
MCH: 27.7 pg (ref 26.0–34.0)
MCHC: 31.6 g/dL (ref 30.0–36.0)
MCV: 87.5 fL (ref 80.0–100.0)
Platelets: 154 10*3/uL (ref 150–400)
RBC: 3.76 MIL/uL — ABNORMAL LOW (ref 4.22–5.81)
RDW: 14.4 % (ref 11.5–15.5)
WBC: 5.4 10*3/uL (ref 4.0–10.5)
nRBC: 0 % (ref 0.0–0.2)

## 2018-10-29 LAB — BASIC METABOLIC PANEL
Anion gap: 15 (ref 5–15)
BUN: 9 mg/dL (ref 8–23)
CO2: 25 mmol/L (ref 22–32)
Calcium: 9.3 mg/dL (ref 8.9–10.3)
Chloride: 97 mmol/L — ABNORMAL LOW (ref 98–111)
Creatinine, Ser: 0.45 mg/dL — ABNORMAL LOW (ref 0.61–1.24)
GFR calc Af Amer: >60 mL/min (ref >60)
GFR calc non Af Amer: >60 mL/min (ref >60)
Glucose, Bld: 109 mg/dL — ABNORMAL HIGH (ref 70–99)
Potassium: 3.9 mmol/L (ref 3.5–5.1)
Sodium: 137 mmol/L (ref 135–145)

## 2018-10-29 NOTE — Progress Notes (Addendum)
Pulmonary Critical Care Medicine [place]   PULMONARY CRITICAL CARE SERVICE  PROGRESS NOTE  Date of Service: 10/29/2018  Carlos Aguilar  [id number]  DOB: 17-Nov-1945   DOA: 10/07/2018  Referring Physician: Merton Border, MD  HPI: Carlos Aguilar is a 72 y.o. male seen for follow up of Acute on Chronic Respiratory Failure.  Patient continues on aerosol collar 28% FiO2 satting well no fever distress noted.  Medications: Reviewed on Rounds  Physical Exam:  Vitals: Pulse 78 respirations 20 BP 133/68 O2 sat 93% temp 97.6  Ventilator Settings ATC 28%  . General: Comfortable at this time . Eyes: Grossly normal lids, irises & conjunctiva . ENT: grossly tongue is normal . Neck: no obvious mass . Cardiovascular: S1 S2 normal no gallop . Respiratory: No rales or rhonchi noted . Abdomen: soft . Skin: no rash seen on limited exam . Musculoskeletal: not rigid . Psychiatric:unable to assess . Neurologic: no seizure no involuntary movements         Lab Data:   Basic Metabolic Panel: No results for input(s): NA, K, CL, CO2, GLUCOSE, BUN, CREATININE, CALCIUM, MG, PHOS in the last 168 hours.  ABG: No results for input(s): PHART, PCO2ART, PO2ART, HCO3, O2SAT in the last 168 hours.  Liver Function Tests: No results for input(s): AST, ALT, ALKPHOS, BILITOT, PROT, ALBUMIN in the last 168 hours. No results for input(s): LIPASE, AMYLASE in the last 168 hours. No results for input(s): AMMONIA in the last 168 hours.  CBC: No results for input(s): WBC, NEUTROABS, HGB, HCT, MCV, PLT in the last 168 hours.  Cardiac Enzymes: No results for input(s): CKTOTAL, CKMB, CKMBINDEX, TROPONINI in the last 168 hours.  BNP (last 3 results) No results for input(s): BNP in the last 8760 hours.  ProBNP (last 3 results) No results for input(s): PROBNP in the last 8760 hours.  Radiological Exams: No results found.  Assessment/Plan Active Problems:   Acute on chronic respiratory  failure with hypoxia (HCC)   Subarachnoid hemorrhage, nontraumatic (HCC)   Lobar pneumonia, unspecified organism ([place])   Tracheostomy status ([place])   1. Acute on chronic respiratory failure with hypoxia we will continue with T collar trials wean as tolerated 2. Subarachnoid hemorrhage unchanged we will continue with supportive care 3. Lobar pneumonia treated 4. 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 The Patient requires high complexity decision making for assessment and support.  Case was discussed on Rounds with the Respiratory Therapy Staff  Allyne Gee, MD [facility] Pulmonary Critical Care Medicine Sleep Medicine

## 2018-10-30 DIAGNOSIS — Z930 Tracheostomy status: Secondary | ICD-10-CM | POA: Diagnosis not present

## 2018-10-30 DIAGNOSIS — J9621 Acute and chronic respiratory failure with hypoxia: Secondary | ICD-10-CM | POA: Diagnosis not present

## 2018-10-30 DIAGNOSIS — J181 Lobar pneumonia, unspecified organism: Secondary | ICD-10-CM | POA: Diagnosis not present

## 2018-10-30 DIAGNOSIS — I609 Nontraumatic subarachnoid hemorrhage, unspecified: Secondary | ICD-10-CM | POA: Diagnosis not present

## 2018-10-30 NOTE — Progress Notes (Addendum)
Pulmonary Critical Care Medicine [place]   PULMONARY CRITICAL CARE SERVICE  PROGRESS NOTE  Date of Service: 10/30/2018  Jarid Sasso  [id number]  DOB: 11-Jul-1945   DOA: 10/07/2018  Referring Physician: Merton Border, MD  HPI: Carlos Aguilar is a 73 y.o. male seen for follow up of Acute on Chronic Respiratory Failure.  Patient mains on 28-minute aerosol trach collar satting well with no fever or distress.  Medications: Reviewed on Rounds  Physical Exam:  Vitals: Pulse 88 respirations 18 BP 132/72 O2 sat 100% temp 99.1  Ventilator Settings ATC 28%  . General: Comfortable at this time . Eyes: Grossly normal lids, irises & conjunctiva . ENT: grossly tongue is normal . Neck: no obvious mass . Cardiovascular: S1 S2 normal no gallop . Respiratory: No rales or rhonchi noted . Abdomen: soft . Skin: no rash seen on limited exam . Musculoskeletal: not rigid . Psychiatric:unable to assess . Neurologic: no seizure no involuntary movements         Lab Data:   Basic Metabolic Panel: Recent Labs  Lab 10/29/18 1430  NA 137  K 3.9  CL 97*  CO2 25  GLUCOSE 109*  BUN 9  CREATININE 0.45*  CALCIUM 9.3    ABG: No results for input(s): PHART, PCO2ART, PO2ART, HCO3, O2SAT in the last 168 hours.  Liver Function Tests: No results for input(s): AST, ALT, ALKPHOS, BILITOT, PROT, ALBUMIN in the last 168 hours. No results for input(s): LIPASE, AMYLASE in the last 168 hours. No results for input(s): AMMONIA in the last 168 hours.  CBC: Recent Labs  Lab 10/29/18 1430  WBC 5.4  HGB 10.4*  HCT 32.9*  MCV 87.5  PLT 154    Cardiac Enzymes: No results for input(s): CKTOTAL, CKMB, CKMBINDEX, TROPONINI in the last 168 hours.  BNP (last 3 results) No results for input(s): BNP in the last 8760 hours.  ProBNP (last 3 results) No results for input(s): PROBNP in the last 8760 hours.  Radiological Exams: No results found.  Assessment/Plan Active Problems:    Acute on chronic respiratory failure with hypoxia (HCC)   Subarachnoid hemorrhage, nontraumatic (HCC)   Lobar pneumonia, unspecified organism ([place])   Tracheostomy status ([place])   1. Acute on chronic respiratory failure with hypoxia we will continue with T collar trials wean as tolerated 2. Subarachnoid hemorrhage unchanged we will continue with supportive care 3. Lobar pneumonia treated 4. 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 The Patient requires high complexity decision making for assessment and support.  Case was discussed on Rounds with the Respiratory Therapy Staff  Allyne Gee, MD [facility] Pulmonary Critical Care Medicine Sleep Medicine

## 2018-10-31 DIAGNOSIS — I609 Nontraumatic subarachnoid hemorrhage, unspecified: Secondary | ICD-10-CM | POA: Diagnosis not present

## 2018-10-31 DIAGNOSIS — J181 Lobar pneumonia, unspecified organism: Secondary | ICD-10-CM | POA: Diagnosis not present

## 2018-10-31 DIAGNOSIS — Z930 Tracheostomy status: Secondary | ICD-10-CM | POA: Diagnosis not present

## 2018-10-31 DIAGNOSIS — J9621 Acute and chronic respiratory failure with hypoxia: Secondary | ICD-10-CM | POA: Diagnosis not present

## 2018-10-31 NOTE — Progress Notes (Addendum)
Pulmonary Critical Care Medicine [place]   PULMONARY CRITICAL CARE SERVICE  PROGRESS NOTE  Date of Service: 10/31/2018  Carlos Aguilar  [id number]  DOB: 02-17-1945   DOA: 10/07/2018  Referring Physician: Merton Border, MD  HPI: Carlos Aguilar is a 73 y.o. male seen for follow up of Acute on Chronic Respiratory Failure.  Patient continues on aerosol trach collar 28% sat well no fever or distress.  Medications: Reviewed on Rounds  Physical Exam:  Vitals: Pulse 78 respirations 18 BP 120/73 O2 sat 99% temp 98.6  Ventilator Settings AC 28%  . General: Comfortable at this time . Eyes: Grossly normal lids, irises & conjunctiva . ENT: grossly tongue is normal . Neck: no obvious mass . Cardiovascular: S1 S2 normal no gallop . Respiratory: No rales or rhonchi noted . Abdomen: soft . Skin: no rash seen on limited exam . Musculoskeletal: not rigid . Psychiatric:unable to assess . Neurologic: no seizure no involuntary movements         Lab Data:   Basic Metabolic Panel: Recent Labs  Lab 10/29/18 1430  NA 137  K 3.9  CL 97*  CO2 25  GLUCOSE 109*  BUN 9  CREATININE 0.45*  CALCIUM 9.3    ABG: No results for input(s): PHART, PCO2ART, PO2ART, HCO3, O2SAT in the last 168 hours.  Liver Function Tests: No results for input(s): AST, ALT, ALKPHOS, BILITOT, PROT, ALBUMIN in the last 168 hours. No results for input(s): LIPASE, AMYLASE in the last 168 hours. No results for input(s): AMMONIA in the last 168 hours.  CBC: Recent Labs  Lab 10/29/18 1430  WBC 5.4  HGB 10.4*  HCT 32.9*  MCV 87.5  PLT 154    Cardiac Enzymes: No results for input(s): CKTOTAL, CKMB, CKMBINDEX, TROPONINI in the last 168 hours.  BNP (last 3 results) No results for input(s): BNP in the last 8760 hours.  ProBNP (last 3 results) No results for input(s): PROBNP in the last 8760 hours.  Radiological Exams: No results found.  Assessment/Plan Active Problems:   Acute on  chronic respiratory failure with hypoxia (HCC)   Subarachnoid hemorrhage, nontraumatic (HCC)   Lobar pneumonia, unspecified organism ([place])   Tracheostomy status ([place])   1. Acute on chronic respiratory failure with hypoxia we will continue with T collar trials wean as tolerated 2. Subarachnoid hemorrhage unchanged we will continue with supportive care 3. Lobar pneumonia treated 4. 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 The Patient requires high complexity decision making for assessment and support.  Case was discussed on Rounds with the Respiratory Therapy Staff  Allyne Gee, MD [facility] Pulmonary Critical Care Medicine Sleep Medicine

## 2018-11-01 DIAGNOSIS — I609 Nontraumatic subarachnoid hemorrhage, unspecified: Secondary | ICD-10-CM | POA: Diagnosis not present

## 2018-11-01 DIAGNOSIS — Z930 Tracheostomy status: Secondary | ICD-10-CM | POA: Diagnosis not present

## 2018-11-01 DIAGNOSIS — J9621 Acute and chronic respiratory failure with hypoxia: Secondary | ICD-10-CM | POA: Diagnosis not present

## 2018-11-01 DIAGNOSIS — J181 Lobar pneumonia, unspecified organism: Secondary | ICD-10-CM | POA: Diagnosis not present

## 2018-11-01 LAB — SARS CORONAVIRUS 2 BY RT PCR (HOSPITAL ORDER, PERFORMED IN ~~LOC~~ HOSPITAL LAB): SARS Coronavirus 2: NEGATIVE

## 2018-11-01 NOTE — Progress Notes (Addendum)
Pulmonary Critical Care Medicine [place]   PULMONARY CRITICAL CARE SERVICE  PROGRESS NOTE  Date of Service: 11/01/2018  Carlos Aguilar  [id number]  DOB: Jul 23, 1945   DOA: 10/07/2018  Referring Physician: Merton Border, MD  HPI: Carlos Aguilar is a 73 y.o. male seen for follow up of Acute on Chronic Respiratory Failure.  Patient is on 20% aerosol trach  Medications: Reviewed on Rounds  Physical Exam:  Vitals: Pulse 83 respirations 18 BP 141/60 O2 sat 97% temp  Ventilator Settings 28% ATC 20  . General: Comfortable at this time . Eyes: Grossly normal lids, irises & conjunctiva . ENT: grossly tongue is normal . Neck: no obvious mass . Cardiovascular: S1 S2 normal no gallop . Respiratory: No rales or rhonchi noted . Abdomen: soft . Skin: no rash seen on limited exam . Musculoskeletal: not rigid . Psychiatric:unable to assess . Neurologic: no seizure no involuntary movements         Lab Data:   Basic Metabolic Panel: Recent Labs  Lab 10/29/18 1430  NA 137  K 3.9  CL 97*  CO2 25  GLUCOSE 109*  BUN 9  CREATININE 0.45*  CALCIUM 9.3    ABG: No results for input(s): PHART, PCO2ART, PO2ART, HCO3, O2SAT in the last 168 hours.  Liver Function Tests: No results for input(s): AST, ALT, ALKPHOS, BILITOT, PROT, ALBUMIN in the last 168 hours. No results for input(s): LIPASE, AMYLASE in the last 168 hours. No results for input(s): AMMONIA in the last 168 hours.  CBC: Recent Labs  Lab 10/29/18 1430  WBC 5.4  HGB 10.4*  HCT 32.9*  MCV 87.5  PLT 154    Cardiac Enzymes: No results for input(s): CKTOTAL, CKMB, CKMBINDEX, TROPONINI in the last 168 hours.  BNP (last 3 results) No results for input(s): BNP in the last 8760 hours.  ProBNP (last 3 results) No results for input(s): PROBNP in the last 8760 hours.  Radiological Exams: No results found.  Assessment/Plan Active Problems:   Acute on chronic respiratory failure with hypoxia (HCC)    Subarachnoid hemorrhage, nontraumatic (HCC)   Lobar pneumonia, unspecified organism ([place])   Tracheostomy status ([place])   1. Acute on chronic respiratory failure with hypoxia we will continue with T collar trials wean as tolerated 2. Subarachnoid hemorrhage unchanged we will continue with supportive care 3. Lobar pneumonia treated 4. Tracheostomy remains in placeyes   I have personally seen and evaluated the patient, evaluated laboratory and imaging results, formulated the assessment and plan and placed orders. The Patient requires high complexity decision making for assessment and support.  Case was discussed on Rounds with the Respiratory Therapy Staff  Allyne Gee, MD [facility] Pulmonary Critical Care Medicine Sleep Medicine

## 2018-11-03 LAB — CULTURE, RESPIRATORY W GRAM STAIN: Gram Stain: NONE SEEN

## 2018-11-28 DEATH — deceased

## 2021-08-09 IMAGING — DX DG ABDOMEN 1V
1 series · 1 of 1 positions shown · non-contrast
Comparison: None.

CLINICAL DATA: Peg tube

EXAM:
ABDOMEN - 1 VIEW

[abdomen kub]
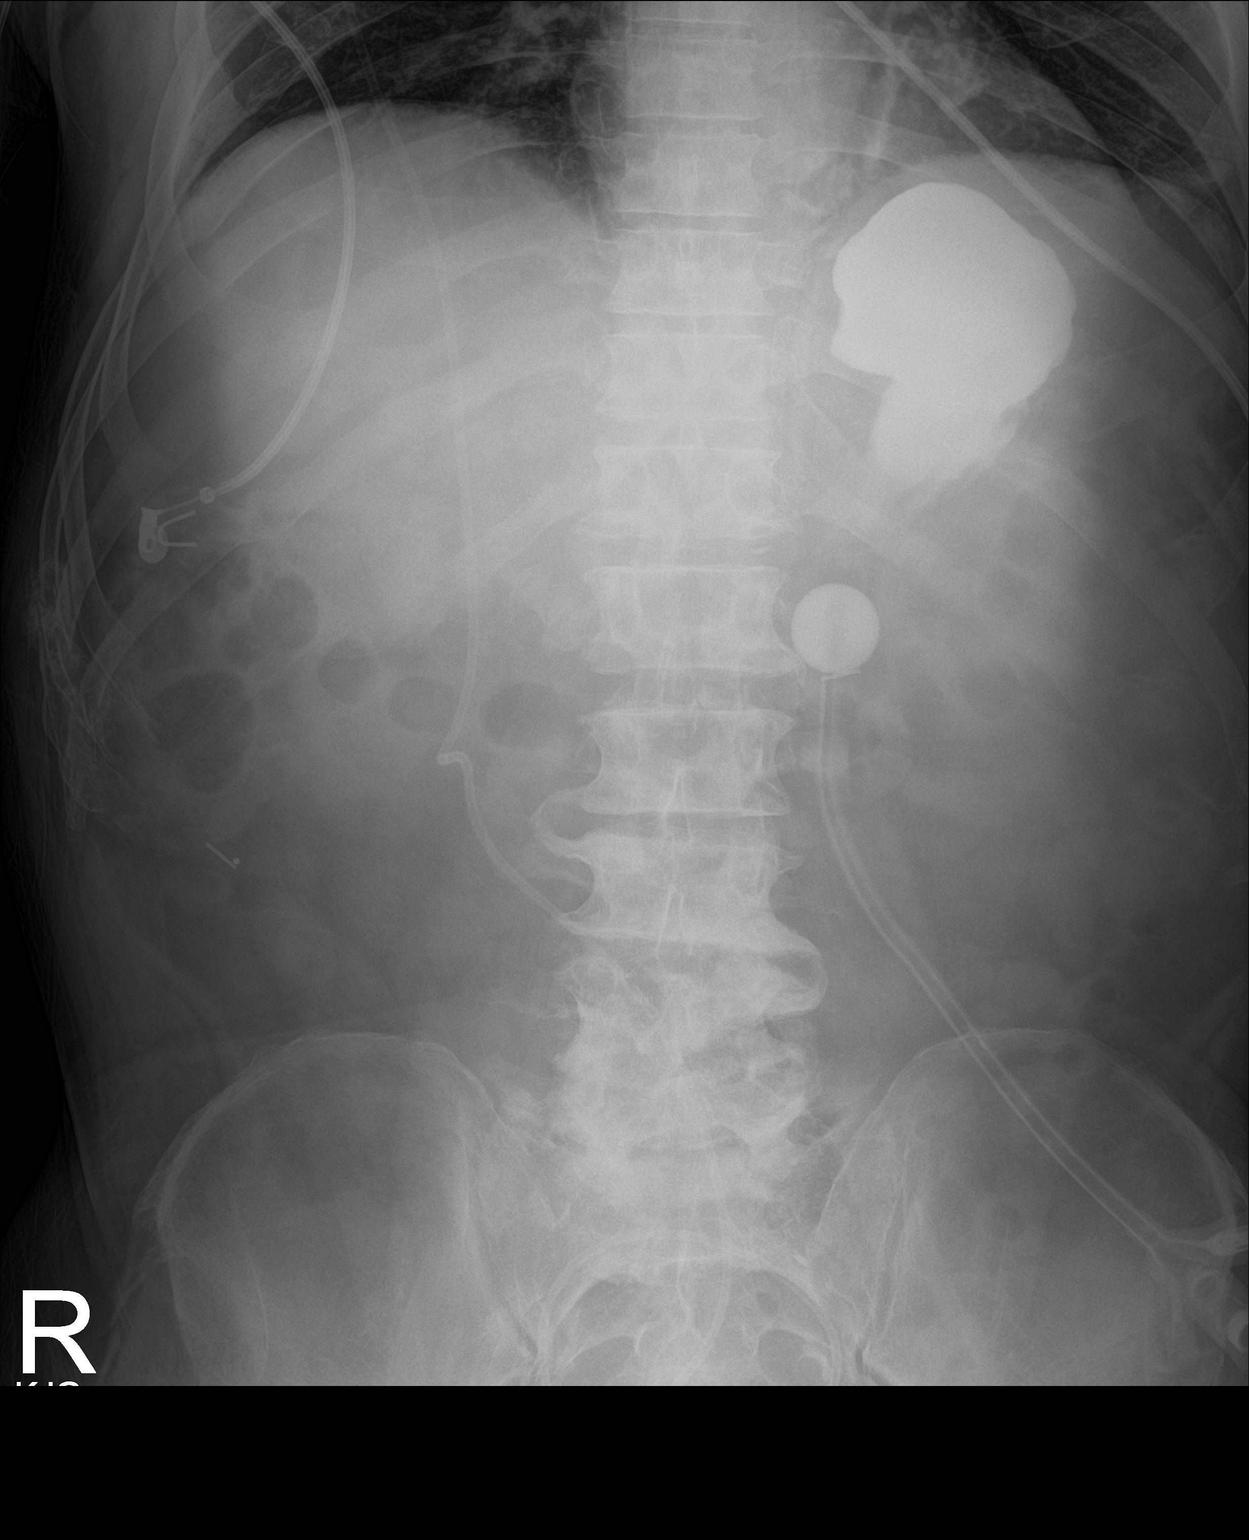

[1 of 1 positions shown; findings below may reference images not displayed]

FINDINGS: Peg tube projects over the left upper abdomen. Contrast material is
seen within the fundus of the stomach. No contrast extravasation.
Nonobstructive bowel gas pattern.
IMPRESSION: Peg tube within the stomach. Injected contrast is within the fundus
of the stomach. No extravasation.
